# Patient Record
Sex: Female | Born: 1950
Health system: Southern US, Community
[De-identification: ages and names within clinical notes are randomized; demographics above are authoritative.]

## PROBLEM LIST (undated history)

## (undated) DIAGNOSIS — E559 Vitamin D deficiency, unspecified: Secondary | ICD-10-CM

## (undated) DIAGNOSIS — E079 Disorder of thyroid, unspecified: Secondary | ICD-10-CM

## (undated) DIAGNOSIS — B009 Herpesviral infection, unspecified: Secondary | ICD-10-CM

## (undated) DIAGNOSIS — M810 Age-related osteoporosis without current pathological fracture: Secondary | ICD-10-CM

## (undated) DIAGNOSIS — M1990 Unspecified osteoarthritis, unspecified site: Secondary | ICD-10-CM

## (undated) DIAGNOSIS — R899 Unspecified abnormal finding in specimens from other organs, systems and tissues: Secondary | ICD-10-CM

## (undated) DIAGNOSIS — Z8669 Personal history of other diseases of the nervous system and sense organs: Secondary | ICD-10-CM

## (undated) DIAGNOSIS — L900 Lichen sclerosus et atrophicus: Secondary | ICD-10-CM

## (undated) DIAGNOSIS — F988 Other specified behavioral and emotional disorders with onset usually occurring in childhood and adolescence: Secondary | ICD-10-CM

## (undated) DIAGNOSIS — G43909 Migraine, unspecified, not intractable, without status migrainosus: Secondary | ICD-10-CM

## (undated) DIAGNOSIS — F32A Depression, unspecified: Secondary | ICD-10-CM

## (undated) DIAGNOSIS — I729 Aneurysm of unspecified site: Secondary | ICD-10-CM

## (undated) DIAGNOSIS — N879 Dysplasia of cervix uteri, unspecified: Secondary | ICD-10-CM

## (undated) DIAGNOSIS — G4700 Insomnia, unspecified: Secondary | ICD-10-CM

## (undated) DIAGNOSIS — R413 Other amnesia: Secondary | ICD-10-CM

## (undated) DIAGNOSIS — F329 Major depressive disorder, single episode, unspecified: Secondary | ICD-10-CM

## (undated) DIAGNOSIS — S9032XA Contusion of left foot, initial encounter: Secondary | ICD-10-CM

## (undated) HISTORY — DX: Disorder of thyroid, unspecified: E07.9

## (undated) HISTORY — DX: Unspecified osteoarthritis, unspecified site: M19.90

## (undated) HISTORY — PX: OTHER SURGICAL HISTORY: SHX169

## (undated) HISTORY — DX: Migraine, unspecified, not intractable, without status migrainosus: G43.909

## (undated) HISTORY — DX: Age-related osteoporosis without current pathological fracture: M81.0

## (undated) HISTORY — PX: HERNIA REPAIR: SHX51

## (undated) HISTORY — PX: GYNECOLOGIC CRYOSURGERY: SHX857

## (undated) HISTORY — DX: Major depressive disorder, single episode, unspecified: F32.9

## (undated) HISTORY — DX: Herpesviral infection, unspecified: B00.9

## (undated) HISTORY — DX: Other amnesia: R41.3

## (undated) HISTORY — PX: COLPOSCOPY: SHX161

## (undated) HISTORY — DX: Unspecified abnormal finding in specimens from other organs, systems and tissues: R89.9

## (undated) HISTORY — DX: Contusion of left foot, initial encounter: S90.32XA

## (undated) HISTORY — DX: Aneurysm of unspecified site: I72.9

## (undated) HISTORY — DX: Vitamin D deficiency, unspecified: E55.9

## (undated) HISTORY — DX: Lichen sclerosus et atrophicus: L90.0

## (undated) HISTORY — DX: Dysplasia of cervix uteri, unspecified: N87.9

## (undated) HISTORY — DX: Depression, unspecified: F32.A

## (undated) HISTORY — PX: TUBAL LIGATION: SHX77

## (undated) HISTORY — DX: Insomnia, unspecified: G47.00

## (undated) HISTORY — DX: Personal history of other diseases of the nervous system and sense organs: Z86.69

## (undated) HISTORY — DX: Other specified behavioral and emotional disorders with onset usually occurring in childhood and adolescence: F98.8

---

## 1998-10-27 ENCOUNTER — Other Ambulatory Visit: Admission: RE | Admit: 1998-10-27 | Discharge: 1998-10-27 | Payer: Self-pay | Admitting: Obstetrics and Gynecology

## 1999-09-24 ENCOUNTER — Ambulatory Visit (HOSPITAL_COMMUNITY): Admission: RE | Admit: 1999-09-24 | Discharge: 1999-09-24 | Payer: Self-pay | Admitting: Gastroenterology

## 1999-10-08 ENCOUNTER — Other Ambulatory Visit: Admission: RE | Admit: 1999-10-08 | Discharge: 1999-10-08 | Payer: Self-pay | Admitting: Obstetrics and Gynecology

## 1999-10-15 ENCOUNTER — Other Ambulatory Visit: Admission: RE | Admit: 1999-10-15 | Discharge: 1999-10-15 | Payer: Self-pay | Admitting: Obstetrics and Gynecology

## 1999-10-15 ENCOUNTER — Encounter (INDEPENDENT_AMBULATORY_CARE_PROVIDER_SITE_OTHER): Payer: Self-pay

## 2000-10-10 ENCOUNTER — Other Ambulatory Visit: Admission: RE | Admit: 2000-10-10 | Discharge: 2000-10-10 | Payer: Self-pay | Admitting: Obstetrics and Gynecology

## 2001-10-12 ENCOUNTER — Other Ambulatory Visit: Admission: RE | Admit: 2001-10-12 | Discharge: 2001-10-12 | Payer: Self-pay | Admitting: Obstetrics and Gynecology

## 2001-11-28 ENCOUNTER — Ambulatory Visit (HOSPITAL_COMMUNITY): Admission: RE | Admit: 2001-11-28 | Discharge: 2001-11-28 | Payer: Self-pay | Admitting: Gastroenterology

## 2002-04-12 ENCOUNTER — Encounter: Admission: RE | Admit: 2002-04-12 | Discharge: 2002-04-12 | Payer: Self-pay | Admitting: General Surgery

## 2002-04-12 ENCOUNTER — Encounter: Payer: Self-pay | Admitting: General Surgery

## 2002-11-06 ENCOUNTER — Other Ambulatory Visit: Admission: RE | Admit: 2002-11-06 | Discharge: 2002-11-06 | Payer: Self-pay | Admitting: Obstetrics and Gynecology

## 2003-11-27 ENCOUNTER — Other Ambulatory Visit: Admission: RE | Admit: 2003-11-27 | Discharge: 2003-11-27 | Payer: Self-pay | Admitting: Obstetrics and Gynecology

## 2007-04-13 ENCOUNTER — Other Ambulatory Visit: Admission: RE | Admit: 2007-04-13 | Discharge: 2007-04-13 | Payer: Self-pay | Admitting: Obstetrics and Gynecology

## 2007-08-31 ENCOUNTER — Encounter: Admission: RE | Admit: 2007-08-31 | Discharge: 2007-08-31 | Payer: Self-pay | Admitting: Obstetrics and Gynecology

## 2008-01-01 ENCOUNTER — Ambulatory Visit: Payer: Self-pay | Admitting: Obstetrics and Gynecology

## 2008-05-15 ENCOUNTER — Ambulatory Visit: Payer: Self-pay | Admitting: Obstetrics and Gynecology

## 2008-09-04 ENCOUNTER — Ambulatory Visit: Payer: Self-pay | Admitting: Obstetrics and Gynecology

## 2008-09-04 ENCOUNTER — Encounter: Admission: RE | Admit: 2008-09-04 | Discharge: 2008-09-04 | Payer: Self-pay | Admitting: Obstetrics and Gynecology

## 2008-09-04 ENCOUNTER — Encounter: Payer: Self-pay | Admitting: Obstetrics and Gynecology

## 2008-09-04 ENCOUNTER — Other Ambulatory Visit: Admission: RE | Admit: 2008-09-04 | Discharge: 2008-09-04 | Payer: Self-pay | Admitting: Obstetrics and Gynecology

## 2008-12-10 ENCOUNTER — Ambulatory Visit: Payer: Self-pay | Admitting: Obstetrics and Gynecology

## 2009-03-17 ENCOUNTER — Ambulatory Visit: Payer: Self-pay | Admitting: Obstetrics and Gynecology

## 2009-07-28 ENCOUNTER — Ambulatory Visit: Payer: Self-pay | Admitting: Obstetrics and Gynecology

## 2009-07-28 ENCOUNTER — Other Ambulatory Visit: Admission: RE | Admit: 2009-07-28 | Discharge: 2009-07-28 | Payer: Self-pay | Admitting: Obstetrics and Gynecology

## 2009-12-30 ENCOUNTER — Encounter: Admission: RE | Admit: 2009-12-30 | Discharge: 2009-12-30 | Payer: Self-pay | Admitting: Obstetrics and Gynecology

## 2010-02-21 ENCOUNTER — Encounter: Payer: Self-pay | Admitting: Emergency Medicine

## 2010-02-21 ENCOUNTER — Observation Stay (HOSPITAL_COMMUNITY): Admission: EM | Admit: 2010-02-21 | Discharge: 2010-02-22 | Payer: Self-pay

## 2010-06-02 LAB — BASIC METABOLIC PANEL
BUN: 17 mg/dL (ref 6–23)
CO2: 30 mEq/L (ref 19–32)
Calcium: 9 mg/dL (ref 8.4–10.5)
Chloride: 103 mEq/L (ref 96–112)
Creatinine, Ser: 0.73 mg/dL (ref 0.4–1.2)
GFR calc Af Amer: >60 mL/min (ref >60)
GFR calc non Af Amer: >60 mL/min (ref >60)
Glucose, Bld: 87 mg/dL (ref 70–99)
Potassium: 3.7 mEq/L (ref 3.5–5.1)
Sodium: 139 mEq/L (ref 135–145)

## 2010-06-02 LAB — CBC
MCH: 31.8 pg (ref 26.0–34.0)
MCHC: 33.6 g/dL (ref 30.0–36.0)
MCV: 94.7 fL (ref 78.0–100.0)
Platelets: 288 10*3/uL (ref 150–400)
RBC: 4.12 MIL/uL (ref 3.87–5.11)

## 2010-06-23 NOTE — Discharge Summary (Signed)
  Miranda Pierce, Miranda Pierce                 ACCOUNT NO.:  [id number]  MEDICAL RECORD NO.:  [id number]          PATIENT TYPE:  INP  LOCATION:  5153                         FACILITY:  [facility]  PHYSICIAN:  Ollen Gross. Vernell Morgans, M.D. DATE OF BIRTH:  [birth year]  DATE OF ADMISSION:  02/21/2010 DATE OF DISCHARGE:  02/22/2010                              DISCHARGE SUMMARY   DISCHARGE DIAGNOSES: 1. Fall from level ground while roller skating. 2. Closed head injury/concussion.  HISTORY ON ADMISSION:  This is a 60 year old female who was apparently admitted after falling while roller skating.  She fell backwards and hit her head on the floor.  This was a witnessed fall.  She had no loss of consciousness.  She was brought to the emergency room as she appeared somewhat confused and was asking repetitive questions since her fall. Workup at this time including a CT scan of the head was without acute intracranial abnormality.  It looks like the patient was probably originally seen at [facility] and then transferred to [facility] to the Trauma Service for observation, serial neurologic checks, antiemetics, and IV fluids.  HOSPITAL COURSE:  The patient was doing very well the following day. She was ambulatory, tolerating a regular diet, was able to be discharged home.  She is going to follow up in the Trauma Clinic in a couple of weeks. She is to continue all of her other home medications.  DIET:  Regular.     Shawn Rayburn, P.A.   ______________________________ Ollen Gross. Vernell Morgans, M.D.    SR/MEDQ  D:  06/02/2010  T:  06/03/2010  Job:  [id number]  Electronically Signed by Lazaro Arms P.A. on 06/03/2010 04:23:20 PM Electronically Signed by Chevis Pretty III M.D. on 06/15/2010 07:30:48 AM

## 2010-08-07 NOTE — Op Note (Signed)
[place]  Patient:    Miranda Pierce, Miranda Pierce                          MRN: [id number]. Date: 09/24/99 Attending:  Fayrene Fearing L. Randa Evens, M.D. CC:         C. Donovan Kail, M.D.                           Operative Report  PROCEDURE:  Colonoscopy with polypectomy  MEDICATIONS:  Fentanyl 100 mcg, Versed 10 mg IV.  INDICATIONS:  Heme positive stool in a 60 year old woman.  SCOPE:  Pediatric video colonoscope.  DESCRIPTION OF PROCEDURE:  Procedure having been explained to patient, consent obtained.  Patient in left lateral decubitus position.  The Olympus pediatric video colonoscope inserted, advanced under direct visualization.  A large polyp was seen in the rectosigmoid area upon entering.  We advanced rapidly to the cecum. Right lower quadrant transilluminated.  Ileocecal valve seen.  The scope was withdrawn.  The cecum, ascending colon, hepatic flexure, transverse colon, splenic flexure, descending and sigmoid colon were seen.  Twelve cm from the anal verge, a 1.75 cm polyp on a snare was seen.  This was an ulcerated, very angry looking polyp.  It was removed with the snare and recovered.  There was no bleeding at the polypectomy site.  The scope was withdrawn.  The patient tolerated the procedure well.  ASSESSMENT:  Rectosigmoid polyp removed with snare.  PLAN:  Will check pathology and due to the size of the polyp, will recommend a 2-year repeat colonoscopy. DD:  09/24/99 TD:  09/24/99 Job: [id number]

## 2010-08-07 NOTE — Op Note (Signed)
   NAME:  Miranda Pierce, Miranda Pierce                           ACCOUNT NO.:  [id number]   MEDICAL RECORD NO.:  [id number]                   PATIENT TYPE:  AMB   LOCATION:  ENDO                                 FACILITY:  [facility]   PHYSICIAN:  James L. Malon Kindle., M.D.          DATE OF BIRTH:  1950/06/07   DATE OF PROCEDURE:  11/28/2001  DATE OF DISCHARGE:                                 OPERATIVE REPORT   PROCEDURE:  Colonoscopy.   MEDICATIONS:  Fentanyl 75 mcg, Versed 6 mg IV.   SCOPE:  Olympus pediatric video colonoscope.   INDICATIONS:  Patient with a previous 2 cm rectal polyp removed.  This is  done as a two-year follow-up.   DESCRIPTION OF PROCEDURE:  The procedure had been explained to the patient  and consent obtained.  With the patient in the left lateral decubitus  position, the Olympus pediatric video colonoscope inserted and advanced  under direct visualization.  Prep excellent, and we were able to reach the  cecum without difficulty, with the ileocecal valve and appendiceal orifice  seen.  The scope withdrawn, and the cecum, ascending colon, hepatic flexure,  transverse colon, splenic flexure, descending, and sigmoid colon were seen  well upon removal.  No polyps or other lesions seen.  The rectum was  carefully examined and was free of polyps.  The scope was withdrawn.  The  patient tolerated the procedure well, was maintained on low-flow oxygen and  pulse oximetry throughout the procedure.   ASSESSMENT:  No evidence of further colon polyps.   PLAN:  Will recommend repeating in three years.                                               James L. Malon Kindle., M.D.    Miranda Pierce  D:  11/28/2001  T:  11/28/2001  Job:  [id number]   cc:   C. Miguel Aschoff, M.D.  [address]  [place]  [place] [address]  Fax: [phone]

## 2010-08-19 ENCOUNTER — Other Ambulatory Visit (HOSPITAL_COMMUNITY)
Admission: RE | Admit: 2010-08-19 | Discharge: 2010-08-19 | Disposition: A | Discharge status: Home / Self Care | Payer: BC Managed Care – PPO | Source: Ambulatory Visit | Attending: Obstetrics and Gynecology | Admitting: Obstetrics and Gynecology

## 2010-08-19 ENCOUNTER — Encounter (INDEPENDENT_AMBULATORY_CARE_PROVIDER_SITE_OTHER): Payer: BC Managed Care – PPO | Admitting: Obstetrics and Gynecology

## 2010-08-19 ENCOUNTER — Other Ambulatory Visit: Payer: Self-pay | Admitting: Obstetrics and Gynecology

## 2010-08-19 DIAGNOSIS — Z01419 Encounter for gynecological examination (general) (routine) without abnormal findings: Secondary | ICD-10-CM

## 2010-08-19 DIAGNOSIS — Z13220 Encounter for screening for lipoid disorders: Secondary | ICD-10-CM

## 2010-08-19 DIAGNOSIS — Z124 Encounter for screening for malignant neoplasm of cervix: Secondary | ICD-10-CM | POA: Insufficient documentation

## 2010-08-19 DIAGNOSIS — E079 Disorder of thyroid, unspecified: Secondary | ICD-10-CM

## 2010-08-19 DIAGNOSIS — M899 Disorder of bone, unspecified: Secondary | ICD-10-CM

## 2010-08-19 DIAGNOSIS — R82998 Other abnormal findings in urine: Secondary | ICD-10-CM

## 2010-09-09 ENCOUNTER — Encounter (INDEPENDENT_AMBULATORY_CARE_PROVIDER_SITE_OTHER): Payer: BC Managed Care – PPO

## 2010-09-09 DIAGNOSIS — M899 Disorder of bone, unspecified: Secondary | ICD-10-CM

## 2010-09-09 DIAGNOSIS — M949 Disorder of cartilage, unspecified: Secondary | ICD-10-CM

## 2010-12-14 ENCOUNTER — Other Ambulatory Visit: Payer: Self-pay | Admitting: Obstetrics and Gynecology

## 2010-12-14 DIAGNOSIS — Z1231 Encounter for screening mammogram for malignant neoplasm of breast: Secondary | ICD-10-CM

## 2010-12-31 ENCOUNTER — Other Ambulatory Visit: Payer: Self-pay | Admitting: Obstetrics and Gynecology

## 2011-01-05 ENCOUNTER — Ambulatory Visit
Admission: RE | Admit: 2011-01-05 | Discharge: 2011-01-05 | Disposition: A | Discharge status: Home / Self Care | Payer: BC Managed Care – PPO | Source: Ambulatory Visit | Attending: Obstetrics and Gynecology | Admitting: Obstetrics and Gynecology

## 2011-01-05 DIAGNOSIS — Z1231 Encounter for screening mammogram for malignant neoplasm of breast: Secondary | ICD-10-CM

## 2011-06-09 ENCOUNTER — Ambulatory Visit (INDEPENDENT_AMBULATORY_CARE_PROVIDER_SITE_OTHER): Payer: BC Managed Care – PPO | Admitting: Licensed Clinical Social Worker

## 2011-06-09 DIAGNOSIS — F4323 Adjustment disorder with mixed anxiety and depressed mood: Secondary | ICD-10-CM

## 2011-06-16 ENCOUNTER — Ambulatory Visit (INDEPENDENT_AMBULATORY_CARE_PROVIDER_SITE_OTHER): Payer: BC Managed Care – PPO | Admitting: Licensed Clinical Social Worker

## 2011-06-16 DIAGNOSIS — F331 Major depressive disorder, recurrent, moderate: Secondary | ICD-10-CM

## 2011-06-21 ENCOUNTER — Ambulatory Visit: Payer: BC Managed Care – PPO | Admitting: Licensed Clinical Social Worker

## 2011-06-28 ENCOUNTER — Encounter: Payer: Self-pay | Admitting: Gynecology

## 2011-06-28 DIAGNOSIS — M8580 Other specified disorders of bone density and structure, unspecified site: Secondary | ICD-10-CM | POA: Insufficient documentation

## 2011-06-28 DIAGNOSIS — G43909 Migraine, unspecified, not intractable, without status migrainosus: Secondary | ICD-10-CM | POA: Insufficient documentation

## 2011-06-28 DIAGNOSIS — L900 Lichen sclerosus et atrophicus: Secondary | ICD-10-CM | POA: Insufficient documentation

## 2011-06-28 DIAGNOSIS — M1990 Unspecified osteoarthritis, unspecified site: Secondary | ICD-10-CM | POA: Insufficient documentation

## 2011-06-28 DIAGNOSIS — F988 Other specified behavioral and emotional disorders with onset usually occurring in childhood and adolescence: Secondary | ICD-10-CM | POA: Insufficient documentation

## 2011-06-29 ENCOUNTER — Ambulatory Visit (INDEPENDENT_AMBULATORY_CARE_PROVIDER_SITE_OTHER): Payer: BC Managed Care – PPO | Admitting: Obstetrics and Gynecology

## 2011-06-29 DIAGNOSIS — N760 Acute vaginitis: Secondary | ICD-10-CM

## 2011-06-29 DIAGNOSIS — N898 Other specified noninflammatory disorders of vagina: Secondary | ICD-10-CM

## 2011-06-29 DIAGNOSIS — B373 Candidiasis of vulva and vagina: Secondary | ICD-10-CM

## 2011-06-29 DIAGNOSIS — B3731 Acute candidiasis of vulva and vagina: Secondary | ICD-10-CM

## 2011-06-29 DIAGNOSIS — R102 Pelvic and perineal pain: Secondary | ICD-10-CM

## 2011-06-29 DIAGNOSIS — B9689 Other specified bacterial agents as the cause of diseases classified elsewhere: Secondary | ICD-10-CM

## 2011-06-29 DIAGNOSIS — L293 Anogenital pruritus, unspecified: Secondary | ICD-10-CM

## 2011-06-29 DIAGNOSIS — N949 Unspecified condition associated with female genital organs and menstrual cycle: Secondary | ICD-10-CM

## 2011-06-29 DIAGNOSIS — A499 Bacterial infection, unspecified: Secondary | ICD-10-CM

## 2011-06-29 LAB — WET PREP FOR TRICH, YEAST, CLUE: Yeast Wet Prep HPF POC: NONE SEEN

## 2011-06-29 MED ORDER — TERCONAZOLE 0.8 % VA CREA: 1.0000 | TOPICAL_CREAM | Freq: Every day | VAGINAL | Status: AC

## 2011-06-29 MED ORDER — METRONIDAZOLE 0.75 % VA GEL: VAGINAL | Status: DC

## 2011-06-29 NOTE — Patient Instructions (Signed)
Initially just get MetroGel filled.

## 2011-06-29 NOTE — Progress Notes (Signed)
Patient came to see me today because of a vaginal discharge with an odor and vulvar itching. The itching was worse but she used Monistat-3 and is better but not gone. She is also having some lower abdominal discomfort that feels like menstrual cramps although she has no bleeding. This has been going on for several months. There is no pattern to it.  Exam: Kennon Portela present. Abdomen soft without guarding rebound or masses. Pelvic exam: External: 3+vulvitis. BUS within normal limits. Vaginal exam: Within normal limits. Cervix: Clean. Uterus: Within normal limits. Adnexa: Within normal limits. Rectovaginal exam: Within normal limits.  Wet prep positive for WBCs and bacteria only.  Assessment: #1. Bacterial vaginosis #2. Probable persistent yeast vaginitis #3. Lower pelvic pain  Plan: MetroGel vaginal cream 1 applicator full at bedtime in the vagina for 5 days. If she still has vaginal symptoms after this terconazole 3 cream for 3 days. If pain persists after both call and schedule pelvic ultrasound.

## 2011-07-09 ENCOUNTER — Ambulatory Visit (INDEPENDENT_AMBULATORY_CARE_PROVIDER_SITE_OTHER): Payer: BC Managed Care – PPO | Admitting: Licensed Clinical Social Worker

## 2011-07-09 DIAGNOSIS — F331 Major depressive disorder, recurrent, moderate: Secondary | ICD-10-CM

## 2011-07-14 ENCOUNTER — Ambulatory Visit (INDEPENDENT_AMBULATORY_CARE_PROVIDER_SITE_OTHER): Payer: BC Managed Care – PPO | Admitting: Licensed Clinical Social Worker

## 2011-07-14 DIAGNOSIS — F331 Major depressive disorder, recurrent, moderate: Secondary | ICD-10-CM

## 2011-07-21 ENCOUNTER — Ambulatory Visit: Payer: BC Managed Care – PPO | Admitting: Licensed Clinical Social Worker

## 2011-08-24 ENCOUNTER — Encounter: Payer: Self-pay | Admitting: Obstetrics and Gynecology

## 2011-08-24 ENCOUNTER — Ambulatory Visit (INDEPENDENT_AMBULATORY_CARE_PROVIDER_SITE_OTHER): Payer: BC Managed Care – PPO | Admitting: Obstetrics and Gynecology

## 2011-08-24 VITALS — BP 120/78 | Ht 62.0 in | Wt 163.0 lb

## 2011-08-24 DIAGNOSIS — E039 Hypothyroidism, unspecified: Secondary | ICD-10-CM

## 2011-08-24 DIAGNOSIS — N898 Other specified noninflammatory disorders of vagina: Secondary | ICD-10-CM

## 2011-08-24 DIAGNOSIS — L293 Anogenital pruritus, unspecified: Secondary | ICD-10-CM

## 2011-08-24 DIAGNOSIS — N879 Dysplasia of cervix uteri, unspecified: Secondary | ICD-10-CM | POA: Insufficient documentation

## 2011-08-24 DIAGNOSIS — Z01419 Encounter for gynecological examination (general) (routine) without abnormal findings: Secondary | ICD-10-CM

## 2011-08-24 LAB — CBC WITH DIFFERENTIAL/PLATELET
Basophils Absolute: 0 10*3/uL (ref 0.0–0.1)
Basophils Relative: 0 % (ref 0–1)
Eosinophils Relative: 2 % (ref 0–5)
HCT: 41.5 % (ref 36.0–46.0)
MCHC: 34 g/dL (ref 30.0–36.0)
MCV: 93.7 fL (ref 78.0–100.0)
Monocytes Absolute: 0.4 10*3/uL (ref 0.1–1.0)
Platelets: 321 10*3/uL (ref 150–400)
RDW: 13 % (ref 11.5–15.5)

## 2011-08-24 LAB — WET PREP FOR TRICH, YEAST, CLUE
Clue Cells Wet Prep HPF POC: NONE SEEN
Trich, Wet Prep: NONE SEEN
Yeast Wet Prep HPF POC: NONE SEEN

## 2011-08-24 LAB — TSH: TSH: 1.713 u[IU]/mL (ref 0.350–4.500)

## 2011-08-24 LAB — LIPID PANEL: LDL Cholesterol: 108 mg/dL — ABNORMAL HIGH (ref 0–99)

## 2011-08-24 MED ORDER — CONJ ESTROG-MEDROXYPROGEST ACE 0.45-1.5 MG PO TABS: 1.0000 | ORAL_TABLET | Freq: Every day | ORAL | Status: DC

## 2011-08-24 MED ORDER — AZITHROMYCIN 250 MG PO TABS: ORAL_TABLET | ORAL | Status: DC

## 2011-08-24 NOTE — Progress Notes (Signed)
Patient came to see me today for her annual GYN exam. She remains on hormone replacement therapy due to significant symptoms that recur when she tries to stop it. We treated her for vaginitis several months ago with MetroGel. We have also given her a prescription for terconazole 3 cream which she did not fill but she now is having vaginal itching and discharge. She continues to get occasional itching from her left labia at the site where she had biopsy-proven lichen sclerosus. The skin on the outside is very sensitive and will easily tear with intercourse. She is currently not using the Temovate ointment we have given her. She is having no vaginal bleeding. She is up-to-date on mammograms. She is having no pelvic pain. She is having vaginal dryness in spite of using HRT. Her bone density last year showed low bone mass without an elevated FRAX risk. She is taking calcium and vitamin D. She has not had a fracture. The patient is having sinusitis with increasing drainage, hoarseness and discomfort. She wanted me to check her TSH for the physician that is treating her hypothyroidism. The patient was treated greater than 20 years ago for CIN with cryosurgery and has had normal Paps since then.  Physical examination: kim Julian Reil present. HEENT within normal limits. Neck: Thyroid not large. No masses. Supraclavicular nodes: not enlarged. Breasts: Examined in both sitting and lying  position. No skin changes and no masses. Abdomen: Soft no guarding rebound or masses or hernia. Pelvic: External:area of stable lichen sclerosis on left labia biopsy proven. BUS: Within normal limits. Vaginal:within normal limits. Poor estrogen effect. No evidence of cystocele rectocele or enterocele. Obvious yeast infection. Cervix: clean. Uterus: Normal size and shape. Adnexa: No masses. Rectovaginal exam: Confirmatory and negative.  Assessment: #1. Menopausal symptoms #2. Yeast infection #3. Atrophic vaginitis #4. Osteopenia #5.  Sinusitis #6. Lichen sclerosis  Plan: We had her usual discussion regarding the higher risk of breast cancer with long-term HRT. Patient feels benefits outweigh the risks. We discussed estrogen patch to reduce the risk of DVT. Patient declined. We will continue her Prempro. We added  Vagifem 10 MCG's 1 BIW for vaginal dryness. She was given a Z-Pak for sinusitis. She will continue yearly mammograms. She will have a bone density next year. Extremities: Within normal limits. She will fill her terconazole 3 cream. She will call when necessary for Temovate ointment.

## 2011-08-25 ENCOUNTER — Other Ambulatory Visit: Payer: Self-pay | Admitting: *Deleted

## 2011-08-25 DIAGNOSIS — R17 Unspecified jaundice: Secondary | ICD-10-CM

## 2011-08-25 LAB — URINALYSIS W MICROSCOPIC + REFLEX CULTURE
Bacteria, UA: NONE SEEN
Casts: NONE SEEN
Crystals: NONE SEEN
Glucose, UA: NEGATIVE mg/dL
Ketones, ur: NEGATIVE mg/dL
Leukocytes, UA: NEGATIVE
Nitrite: NEGATIVE
Specific Gravity, Urine: 1.015 (ref 1.005–1.030)
pH: 5.5 (ref 5.0–8.0)

## 2011-08-27 ENCOUNTER — Telehealth: Payer: Self-pay | Admitting: *Deleted

## 2011-08-27 MED ORDER — TERCONAZOLE 0.8 % VA CREA: 1.0000 | TOPICAL_CREAM | Freq: Every day | VAGINAL | Status: AC

## 2011-08-27 NOTE — Telephone Encounter (Signed)
Pt saw had annual on 08/24/11 and thought she has rx at home for yeast that never got filled, but pt lost Rx, terconazole 3 day will be sent to pharmacy per G note.

## 2011-09-03 ENCOUNTER — Other Ambulatory Visit: Payer: BC Managed Care – PPO

## 2011-09-03 ENCOUNTER — Telehealth: Payer: Self-pay | Admitting: *Deleted

## 2011-09-03 DIAGNOSIS — R17 Unspecified jaundice: Secondary | ICD-10-CM

## 2011-09-03 LAB — COMPREHENSIVE METABOLIC PANEL
AST: 16 U/L (ref 0–37)
Alkaline Phosphatase: 63 U/L (ref 39–117)
BUN: 21 mg/dL (ref 6–23)
Calcium: 9.3 mg/dL (ref 8.4–10.5)
Creat: 0.76 mg/dL (ref 0.50–1.10)
Total Bilirubin: 0.5 mg/dL (ref 0.3–1.2)

## 2011-09-03 NOTE — Telephone Encounter (Signed)
Pt has repeat CMP lab draw and asked if fasting was needed, pt informed okay to eat food.

## 2011-10-11 ENCOUNTER — Other Ambulatory Visit: Payer: Self-pay | Admitting: Obstetrics and Gynecology

## 2011-10-15 ENCOUNTER — Telehealth: Payer: Self-pay | Admitting: *Deleted

## 2011-10-15 MED ORDER — ESTRADIOL 10 MCG VA TABS: 10.0000 ug | ORAL_TABLET | VAGINAL | Status: DC

## 2011-10-15 NOTE — Telephone Encounter (Signed)
Pt called requesting rx for vagifem 10 mcg twice weekly. Rx sent pt given samples in office visit 08/24/11. Pt informed

## 2011-12-14 ENCOUNTER — Other Ambulatory Visit: Payer: Self-pay | Admitting: Obstetrics and Gynecology

## 2011-12-14 DIAGNOSIS — Z1231 Encounter for screening mammogram for malignant neoplasm of breast: Secondary | ICD-10-CM

## 2012-01-06 ENCOUNTER — Ambulatory Visit: Payer: BC Managed Care – PPO

## 2012-03-21 ENCOUNTER — Other Ambulatory Visit: Payer: Self-pay | Admitting: Gynecology

## 2012-03-21 DIAGNOSIS — Z1231 Encounter for screening mammogram for malignant neoplasm of breast: Secondary | ICD-10-CM

## 2012-04-18 ENCOUNTER — Ambulatory Visit: Payer: BC Managed Care – PPO

## 2012-05-16 ENCOUNTER — Ambulatory Visit
Admission: RE | Admit: 2012-05-16 | Discharge: 2012-05-16 | Disposition: A | Discharge status: Home / Self Care | Payer: BC Managed Care – PPO | Source: Ambulatory Visit | Attending: Gynecology | Admitting: Gynecology

## 2012-05-16 DIAGNOSIS — Z1231 Encounter for screening mammogram for malignant neoplasm of breast: Secondary | ICD-10-CM

## 2012-05-18 ENCOUNTER — Ambulatory Visit (INDEPENDENT_AMBULATORY_CARE_PROVIDER_SITE_OTHER): Payer: BC Managed Care – PPO | Admitting: Women's Health

## 2012-05-18 ENCOUNTER — Encounter: Payer: Self-pay | Admitting: Women's Health

## 2012-05-18 ENCOUNTER — Other Ambulatory Visit: Payer: Self-pay | Admitting: Women's Health

## 2012-05-18 DIAGNOSIS — N949 Unspecified condition associated with female genital organs and menstrual cycle: Secondary | ICD-10-CM

## 2012-05-18 DIAGNOSIS — N898 Other specified noninflammatory disorders of vagina: Secondary | ICD-10-CM

## 2012-05-18 DIAGNOSIS — R350 Frequency of micturition: Secondary | ICD-10-CM

## 2012-05-18 LAB — URINALYSIS W MICROSCOPIC + REFLEX CULTURE
Crystals: NONE SEEN
Ketones, ur: 15 mg/dL — AB
Nitrite: NEGATIVE
Specific Gravity, Urine: 1.01 (ref 1.005–1.030)
Urobilinogen, UA: 0.2 mg/dL (ref 0.0–1.0)

## 2012-05-18 LAB — WET PREP FOR TRICH, YEAST, CLUE
Clue Cells Wet Prep HPF POC: NONE SEEN
WBC, Wet Prep HPF POC: NONE SEEN

## 2012-05-18 MED ORDER — CLOBETASOL PROP EMOLLIENT BASE 0.05 % EX CREA: TOPICAL_CREAM | CUTANEOUS | Status: DC

## 2012-05-18 NOTE — Progress Notes (Signed)
Patient ID: Leonard Schwartz, female   DOB: 10/20/1950, 62 y.o.   MRN: [id number] Presents with the complaint of increased urinary frequency, denies pain burning or urgency. Denies abdominal pain or fever.reports vaginal itching mostly external. Sexually active with one partner, unfaithful. History of lichen sclerosis.  Exam: External genitalia, patches of white/lichen sclerosis on perineum that extended labia majora,mostly left side speculum exam scant discharge minimal erythema. Wet prep negative. Bimanual no CMT or adnexal fullness or tenderness. GC/Chlamydia culture taken and is pending. UA: Few bacteria,  Lichen sclerosis  Plan: Temovate twice daily to areas of irritation, instructed to call if no relief. Condoms encouraged. Urine culture pending. Declines HIV, hepatitis or RPR today will have assessed at annual exam. Continue counseling.

## 2012-05-19 LAB — GC/CHLAMYDIA PROBE AMP
CT Probe RNA: NEGATIVE
GC Probe RNA: NEGATIVE

## 2012-05-20 LAB — URINE CULTURE: Colony Count: NO GROWTH

## 2012-08-31 ENCOUNTER — Encounter: Payer: Self-pay | Admitting: Women's Health

## 2012-09-28 ENCOUNTER — Encounter: Payer: Self-pay | Admitting: Women's Health

## 2012-09-28 ENCOUNTER — Other Ambulatory Visit (HOSPITAL_COMMUNITY)
Admission: RE | Admit: 2012-09-28 | Discharge: 2012-09-28 | Disposition: A | Discharge status: Home / Self Care | Payer: BC Managed Care – PPO | Source: Ambulatory Visit | Attending: Gynecology | Admitting: Gynecology

## 2012-09-28 ENCOUNTER — Telehealth: Payer: Self-pay | Admitting: *Deleted

## 2012-09-28 ENCOUNTER — Ambulatory Visit (INDEPENDENT_AMBULATORY_CARE_PROVIDER_SITE_OTHER): Payer: BC Managed Care – PPO | Admitting: Women's Health

## 2012-09-28 VITALS — BP 130/88 | Ht 61.5 in | Wt 135.0 lb

## 2012-09-28 DIAGNOSIS — Z01419 Encounter for gynecological examination (general) (routine) without abnormal findings: Secondary | ICD-10-CM

## 2012-09-28 DIAGNOSIS — N63 Unspecified lump in unspecified breast: Secondary | ICD-10-CM

## 2012-09-28 DIAGNOSIS — N898 Other specified noninflammatory disorders of vagina: Secondary | ICD-10-CM

## 2012-09-28 DIAGNOSIS — N949 Unspecified condition associated with female genital organs and menstrual cycle: Secondary | ICD-10-CM

## 2012-09-28 DIAGNOSIS — Z780 Asymptomatic menopausal state: Secondary | ICD-10-CM

## 2012-09-28 MED ORDER — CLOBETASOL PROP EMOLLIENT BASE 0.05 % EX CREA: TOPICAL_CREAM | CUTANEOUS | Status: DC

## 2012-09-28 NOTE — Telephone Encounter (Signed)
Message copied by Aura Camps on Thu Sep 28, 2012  3:05 PM ------      Message from: [place], [place] J      Created: Thu Sep 28, 2012  2:49 PM       Small BB size mobile new nodule in lt lower breast quad. Normal mammogram 05/2012 at [facility], needs diagnostic.   Anytime ok July 31th             [name] ------

## 2012-09-28 NOTE — Patient Instructions (Addendum)
Vit d 2000 dailyHealth Recommendations for Postmenopausal Women Respected and ongoing research has looked at the most common causes of death, disability, and poor quality of life in postmenopausal women. The causes include heart disease, diseases of blood vessels, diabetes, depression, cancer, and bone loss (osteoporosis). Many things can be done to help lower the chances of developing these and other common problems: CARDIOVASCULAR DISEASE Heart Disease: A heart attack is a medical emergency. Know the signs and symptoms of a heart attack. Below are things women can do to reduce their risk for heart disease.   Do not smoke. If you smoke, quit.  Aim for a healthy weight. Being overweight causes many preventable deaths. Eat a healthy and balanced diet and drink an adequate amount of liquids.  Get moving. Make a commitment to be more physically active. Aim for 30 minutes of activity on most, if not all days of the week.  Eat for heart health. Choose a diet that is low in saturated fat and cholesterol and eliminate trans fat. Include whole grains, vegetables, and fruits. Read and understand the labels on food containers before buying.  Know your numbers. Ask your caregiver to check your blood pressure, cholesterol (total, HDL, LDL, triglycerides) and blood glucose. Work with your caregiver on improving your entire clinical picture.  High blood pressure. Limit or stop your table salt intake (try salt substitute and food seasonings). Avoid salty foods and drinks. Read labels on food containers before buying. Eating well and exercising can help control high blood pressure. STROKE  Stroke is a medical emergency. Stroke may be the result of a blood clot in a blood vessel in the brain or by a brain hemorrhage (bleeding). Know the signs and symptoms of a stroke. To lower the risk of developing a stroke:  Avoid fatty foods.  Quit smoking.  Control your diabetes, blood pressure, and irregular heart  rate. THROMBOPHLEBITIS (BLOOD CLOT) OF THE LEG  Becoming overweight and leading a stationary lifestyle may also contribute to developing blood clots. Controlling your diet and exercising will help lower the risk of developing blood clots. CANCER SCREENING  Breast Cancer: Take steps to reduce your risk of breast cancer.  You should practice "breast self-awareness." This means understanding the normal appearance and feel of your breasts and should include breast self-examination. Any changes detected, no matter how small, should be reported to your caregiver.  After age 40, you should have a clinical breast exam (CBE) every year.  Starting at age 40, you should consider having a mammogram (breast X-ray) every year.  If you have a family history of breast cancer, talk to your caregiver about genetic screening.  If you are at high risk for breast cancer, talk to your caregiver about having an MRI and a mammogram every year.  Intestinal or Stomach Cancer: Tests to consider are a rectal exam, fecal occult blood, sigmoidoscopy, and colonoscopy. Women who are high risk may need to be screened at an earlier age and more often.  Cervical Cancer:  Beginning at age 30, you should have a Pap test every 3 years as long as the past 3 Pap tests have been normal.  If you have had past treatment for cervical cancer or a condition that could lead to cancer, you need Pap tests and screening for cancer for at least 20 years after your treatment.  If you had a hysterectomy for a problem that was not cancer or a condition that could lead to cancer, then you no longer need   Pap tests.  If you are between ages 65 and 70, and you have had normal Pap tests going back 10 years, you no longer need Pap tests.  If Pap tests have been discontinued, risk factors (such as a new sexual partner) need to be reassessed to determine if screening should be resumed.  Some medical problems can increase the chance of getting  cervical cancer. In these cases, your caregiver may recommend more frequent screening and Pap tests.  Uterine Cancer: If you have vaginal bleeding after reaching menopause, you should notify your caregiver.  Ovarian cancer: Other than yearly pelvic exams, there are no reliable tests available to screen for ovarian cancer at this time except for yearly pelvic exams.  Lung Cancer: Yearly chest X-rays can detect lung cancer and should be done on high risk women, such as cigarette smokers and women with chronic lung disease (emphysema).  Skin Cancer: A complete body skin exam should be done at your yearly examination. Avoid overexposure to the sun and ultraviolet light lamps. Use a strong sun block cream when in the sun. All of these things are important in lowering the risk of skin cancer. MENOPAUSE Menopause Symptoms: Hormone therapy products are effective for treating symptoms associated with menopause:  Moderate to severe hot flashes.  Night sweats.  Mood swings.  Headaches.  Tiredness.  Loss of sex drive.  Insomnia.  Other symptoms. Hormone replacement carries certain risks, especially in older women. Women who use or are thinking about using estrogen or estrogen with progestin treatments should discuss that with their caregiver. Your caregiver will help you understand the benefits and risks. The ideal dose of hormone replacement therapy is not known. The Food and Drug Administration (FDA) has concluded that hormone therapy should be used only at the lowest doses and for the shortest amount of time to reach treatment goals.  OSTEOPOROSIS Protecting Against Bone Loss and Preventing Fracture: If you use hormone therapy for prevention of bone loss (osteoporosis), the risks for bone loss must outweigh the risk of the therapy. Ask your caregiver about other medications known to be safe and effective for preventing bone loss and fractures. To guard against bone loss or fractures, the  following is recommended:  If you are less than age 50, take 1000 mg of calcium and at least 600 mg of Vitamin D per day.  If you are greater than age 50 but less than age 70, take 1200 mg of calcium and at least 600 mg of Vitamin D per day.  If you are greater than age 70, take 1200 mg of calcium and at least 800 mg of Vitamin D per day. Smoking and excessive alcohol intake increases the risk of osteoporosis. Eat foods rich in calcium and vitamin D and do weight bearing exercises several times a week as your caregiver suggests. DIABETES Diabetes Melitus: If you have Type I or Type 2 diabetes, you should keep your blood sugar under control with diet, exercise and recommended medication. Avoid too many sweets, starchy and fatty foods. Being overweight can make control more difficult. COGNITION AND MEMORY Cognition and Memory: Menopausal hormone therapy is not recommended for the prevention of cognitive disorders such as Alzheimer's disease or memory loss.  DEPRESSION  Depression may occur at any age, but is common in elderly women. The reasons may be because of physical, medical, social (loneliness), or financial problems and needs. If you are experiencing depression because of medical problems and control of symptoms, talk to your caregiver about this.   Physical activity and exercise may help with mood and sleep. Community and volunteer involvement may help your sense of value and worth. If you have depression and you feel that the problem is getting worse or becoming severe, talk to your caregiver about treatment options that are best for you. ACCIDENTS  Accidents are common and can be serious in the elderly woman. Prepare your house to prevent accidents. Eliminate throw rugs, place hand bars in the bath, shower and toilet areas. Avoid wearing high heeled shoes or walking on wet, snowy, and icy areas. Limit or stop driving if you have vision or hearing problems, or you feel you are unsteady with you  movements and reflexes. HEPATITIS C Hepatitis C is a type of viral infection affecting the liver. It is spread mainly through contact with blood from an infected person. It can be treated, but if left untreated, it can lead to severe liver damage over years. Many people who are infected do not know that the virus is in their blood. If you are a "baby-boomer", it is recommended that you have one screening test for Hepatitis C. IMMUNIZATIONS  Several immunizations are important to consider having during your senior years, including:   Tetanus, diptheria, and pertussis booster shot.  Influenza every year before the flu season begins.  Pneumonia vaccine.  Shingles vaccine.  Others as indicated based on your specific needs. Talk to your caregiver about these. Document Released: 04/30/2005 Document Revised: 02/23/2012 Document Reviewed: 12/25/2007 [employer] Patient Information 2014 [employer].  

## 2012-09-28 NOTE — Telephone Encounter (Signed)
Order placed, they will contact pt to schedule. 

## 2012-09-28 NOTE — Progress Notes (Signed)
Miranda Pierce October 15, 1950 [id number]    History:    The patient presents for annual exam.  Postmenopausal on no HRT with no bleeding. History of benign colon polyp, negative colonoscopy 2010. CIN-1 greater than 20 years ago with normal Paps after. Normal mammograms, mammogram 05/2012 3D tomography. Has had zostavac . DEXA 2012 osteopenia T score  -2 right femoral neck, FRAX 9.8%/1.3% .Long-term problems with anxiety and depression seeing a counselor for therapy and meds. Lichen sclerosis. Hypothyroid primary care.  Past medical history, past surgical history, family history and social history were all reviewed and documented in the EPIC chart. Retired, living with her boyfriend near [place], would like to move back to [place] to be closer to grandchildren.   ROS:  A  ROS was performed and pertinent positives and negatives are included in the history.  Exam:  Filed Vitals:   09/29[birth year] 1400  BP: 130/88    General appearance:  Normal Head/Neck:  Normal, without cervical or supraclavicular adenopathy. Thyroid:  Symmetrical, normal in size, without palpable masses or nodularity. Respiratory  Effort:  Normal  Auscultation:  Clear without wheezing or rhonchi Cardiovascular  Auscultation:  Regular rate, without rubs, murmurs or gallops  Edema/varicosities:  Not grossly evident Abdominal  Soft,nontender, without masses, guarding or rebound.  Liver/spleen:  No organomegaly noted  Hernia:  None appreciated  Skin  Inspection:  Grossly normal  Palpation:  Grossly normal Neurologic/psychiatric  Orientation:  Normal with appropriate conversation.  Mood/affect:  Normal  Genitourinary    Breasts: Examined lying and sitting.     Right: Without masses, retractions, discharge or axillary adenopathy.     Left: BB size mobile nontender nodule below areola  External genitalia:  Labia minora bilateral areas of lichen sclerosis  BUS/Urethra/Skene's glands:  Normal  Bladder:  Normal  Vagina:   Normal  Cervix:  Normal  Uterus:   normal in size, shape and contour.  Midline and mobile  Adnexa/parametria:     Rt: Without masses or tenderness.   Lt: Without masses or tenderness.  Anus and perineum: Normal  Digital rectal exam: Normal sphincter tone without palpated masses or tenderness  Assessment/Plan:  62 y.o. D WF G4 P4 for annual exam with complaint of questionable nodule in left breast  Postmenopausal no HRT/no bleeding Osteopenia Lichen sclerosis Hypothyroid-primary care Depression-counselor - meds.  Plan: Diagnostic mammogram left breast BB size mobile nodule. Very concerned, has a friend with breast cancer. Temovate twice daily small amount to irritated areas of labia. Instructed to call if no relief of symptoms. Continue SBE's, annual mammogram, calcium rich diet, vitamin D 2000 daily, home safety and fall prevention importance of regular exercise discussed. Repeat DEXA. Pap,  normal 2012, new screening guidelines reviewed. Blood pressure 130/88 will check away from office if it continues elevated will followup with primary care. Has lost 35 pounds in the past year with diet and exercise, history of eating disorder, reviewed importance of stability of weight.    Harrington Challenger [facility], 5:16 PM 09/28/2012

## 2012-09-29 ENCOUNTER — Encounter: Payer: Self-pay | Admitting: Obstetrics and Gynecology

## 2012-09-29 ENCOUNTER — Other Ambulatory Visit: Payer: Self-pay | Admitting: Women's Health

## 2012-09-29 DIAGNOSIS — N63 Unspecified lump in unspecified breast: Secondary | ICD-10-CM

## 2012-10-04 NOTE — Telephone Encounter (Signed)
appt 10/19/12@ 10:20 am

## 2012-10-19 ENCOUNTER — Other Ambulatory Visit: Payer: BC Managed Care – PPO

## 2012-10-24 ENCOUNTER — Ambulatory Visit
Admission: RE | Admit: 2012-10-24 | Discharge: 2012-10-24 | Disposition: A | Discharge status: Home / Self Care | Payer: Self-pay | Source: Ambulatory Visit | Attending: Women's Health | Admitting: Women's Health

## 2012-10-24 ENCOUNTER — Ambulatory Visit
Admission: RE | Admit: 2012-10-24 | Discharge: 2012-10-24 | Disposition: A | Discharge status: Home / Self Care | Payer: No Typology Code available for payment source | Source: Ambulatory Visit | Attending: Women's Health | Admitting: Women's Health

## 2012-10-24 DIAGNOSIS — N63 Unspecified lump in unspecified breast: Secondary | ICD-10-CM

## 2013-01-25 ENCOUNTER — Other Ambulatory Visit: Payer: Self-pay

## 2013-10-09 ENCOUNTER — Encounter: Payer: BC Managed Care – PPO | Admitting: Women's Health

## 2013-10-12 ENCOUNTER — Encounter: Payer: Self-pay | Admitting: Women's Health

## 2013-10-12 ENCOUNTER — Ambulatory Visit (INDEPENDENT_AMBULATORY_CARE_PROVIDER_SITE_OTHER): Payer: BC Managed Care – PPO | Admitting: Women's Health

## 2013-10-12 VITALS — BP 117/76 | Ht 63.0 in | Wt 141.8 lb

## 2013-10-12 DIAGNOSIS — Z01419 Encounter for gynecological examination (general) (routine) without abnormal findings: Secondary | ICD-10-CM

## 2013-10-12 NOTE — Patient Instructions (Signed)
Health Recommendations for Postmenopausal Women Respected and ongoing research has looked at the most common causes of death, disability, and poor quality of life in postmenopausal women. The causes include heart disease, diseases of blood vessels, diabetes, depression, cancer, and bone loss (osteoporosis). Many things can be done to help lower the chances of developing these and other common problems. CARDIOVASCULAR DISEASE Heart Disease: A heart attack is a medical emergency. Know the signs and symptoms of a heart attack. Below are things women can do to reduce their risk for heart disease.   Do not smoke. If you smoke, quit.  Aim for a healthy weight. Being overweight causes many preventable deaths. Eat a healthy and balanced diet and drink an adequate amount of liquids.  Get moving. Make a commitment to be more physically active. Aim for 30 minutes of activity on most, if not all days of the week.  Eat for heart health. Choose a diet that is low in saturated fat and cholesterol and eliminate trans fat. Include whole grains, vegetables, and fruits. Read and understand the labels on food containers before buying.  Know your numbers. Ask your caregiver to check your blood pressure, cholesterol (total, HDL, LDL, triglycerides) and blood glucose. Work with your caregiver on improving your entire clinical picture.  High blood pressure. Limit or stop your table salt intake (try salt substitute and food seasonings). Avoid salty foods and drinks. Read labels on food containers before buying. Eating well and exercising can help control high blood pressure. STROKE  Stroke is a medical emergency. Stroke may be the result of a blood clot in a blood vessel in the brain or by a brain hemorrhage (bleeding). Know the signs and symptoms of a stroke. To lower the risk of developing a stroke:  Avoid fatty foods.  Quit smoking.  Control your diabetes, blood pressure, and irregular heart rate. THROMBOPHLEBITIS  (BLOOD CLOT) OF THE LEG  Becoming overweight and leading a stationary lifestyle may also contribute to developing blood clots. Controlling your diet and exercising will help lower the risk of developing blood clots. CANCER SCREENING  Breast Cancer: Take steps to reduce your risk of breast cancer.  You should practice "breast self-awareness." This means understanding the normal appearance and feel of your breasts and should include breast self-examination. Any changes detected, no matter how small, should be reported to your caregiver.  After age 40, you should have a clinical breast exam (CBE) every year.  Starting at age 40, you should consider having a mammogram (breast X-ray) every year.  If you have a family history of breast cancer, talk to your caregiver about genetic screening.  If you are at high risk for breast cancer, talk to your caregiver about having an MRI and a mammogram every year.  Intestinal or Stomach Cancer: Tests to consider are a rectal exam, fecal occult blood, sigmoidoscopy, and colonoscopy. Women who are high risk may need to be screened at an earlier age and more often.  Cervical Cancer:  Beginning at age 30, you should have a Pap test every 3 years as long as the past 3 Pap tests have been normal.  If you have had past treatment for cervical cancer or a condition that could lead to cancer, you need Pap tests and screening for cancer for at least 20 years after your treatment.  If you had a hysterectomy for a problem that was not cancer or a condition that could lead to cancer, then you no longer need Pap tests.    If you are between ages 65 and 70, and you have had normal Pap tests going back 10 years, you no longer need Pap tests.  If Pap tests have been discontinued, risk factors (such as a new sexual partner) need to be reassessed to determine if screening should be resumed.  Some medical problems can increase the chance of getting cervical cancer. In these  cases, your caregiver may recommend more frequent screening and Pap tests.  Uterine Cancer: If you have vaginal bleeding after reaching menopause, you should notify your caregiver.  Ovarian Cancer: Other than yearly pelvic exams, there are no reliable tests available to screen for ovarian cancer at this time except for yearly pelvic exams.  Lung Cancer: Yearly chest X-rays can detect lung cancer and should be done on high risk women, such as cigarette smokers and women with chronic lung disease (emphysema).  Skin Cancer: A complete body skin exam should be done at your yearly examination. Avoid overexposure to the sun and ultraviolet light lamps. Use a strong sun block cream when in the sun. All of these things are important for lowering the risk of skin cancer. MENOPAUSE Menopause Symptoms: Hormone therapy products are effective for treating symptoms associated with menopause:  Moderate to severe hot flashes.  Night sweats.  Mood swings.  Headaches.  Tiredness.  Loss of sex drive.  Insomnia.  Other symptoms. Hormone replacement carries certain risks, especially in older women. Women who use or are thinking about using estrogen or estrogen with progestin treatments should discuss that with their caregiver. Your caregiver will help you understand the benefits and risks. The ideal dose of hormone replacement therapy is not known. The Food and Drug Administration (FDA) has concluded that hormone therapy should be used only at the lowest doses and for the shortest amount of time to reach treatment goals.  OSTEOPOROSIS Protecting Against Bone Loss and Preventing Fracture If you use hormone therapy for prevention of bone loss (osteoporosis), the risks for bone loss must outweigh the risk of the therapy. Ask your caregiver about other medications known to be safe and effective for preventing bone loss and fractures. To guard against bone loss or fractures, the following is recommended:  If  you are younger than age 50, take 1000 mg of calcium and at least 600 mg of Vitamin D per day.  If you are older than age 50 but younger than age 70, take 1200 mg of calcium and at least 600 mg of Vitamin D per day.  If you are older than age 70, take 1200 mg of calcium and at least 800 mg of Vitamin D per day. Smoking and excessive alcohol intake increases the risk of osteoporosis. Eat foods rich in calcium and vitamin D and do weight bearing exercises several times a week as your caregiver suggests. DIABETES Diabetes Mellitus: If you have type I or type 2 diabetes, you should keep your blood sugar under control with diet, exercise, and recommended medication. Avoid starchy and fatty foods, and too many sweets. Being overweight can make diabetes control more difficult. COGNITION AND MEMORY Cognition and Memory: Menopausal hormone therapy is not recommended for the prevention of cognitive disorders such as Alzheimer's disease or memory loss.  DEPRESSION  Depression may occur at any age, but it is common in elderly women. This may be because of physical, medical, social (loneliness), or financial problems and needs. If you are experiencing depression because of medical problems and control of symptoms, talk to your caregiver about this. Physical   activity and exercise may help with mood and sleep. Community and volunteer involvement may improve your sense of value and worth. If you have depression and you feel that the problem is getting worse or becoming severe, talk to your caregiver about which treatment options are best for you. ACCIDENTS  Accidents are common and can be serious in elderly woman. Prepare your house to prevent accidents. Eliminate throw rugs, place hand bars in bath, shower, and toilet areas. Avoid wearing high heeled shoes or walking on wet, snowy, and icy areas. Limit or stop driving if you have vision or hearing problems, or if you feel you are unsteady with your movements and  reflexes. HEPATITIS C Hepatitis C is a type of viral infection affecting the liver. It is spread mainly through contact with blood from an infected person. It can be treated, but if left untreated, it can lead to severe liver damage over the years. Many people who are infected do not know that the virus is in their blood. If you are a "baby-boomer", it is recommended that you have one screening test for Hepatitis C. IMMUNIZATIONS  Several immunizations are important to consider having during your senior years, including:   Tetanus, diphtheria, and pertussis booster shot.  Influenza every year before the flu season begins.  Pneumonia vaccine.  Shingles vaccine.  Others, as indicated based on your specific needs. Talk to your caregiver about these. Document Released: 04/30/2005 Document Revised: 07/23/2013 Document Reviewed: 12/25/2007 Exit[employer] Patient Information 2015 [employer]. This information is not intended to replace advice given to you by your health care provider. Make sure you discuss any questions you have with your health care provider.  

## 2013-10-12 NOTE — Progress Notes (Signed)
Miranda SchwartzKaren L Pierce 08/11/1950 [id number]    History:    Presents for annual exam.  Postmenopausal on no HRT/no bleeding. Normal mammogram history. CIN-1 greater than 20 years ago with normal Paps after. Has had zostavac. 2012 T score -2  right femoral neck FRAX 9.8%/1.3%. Negative colonoscopy 2010. Lichen sclerosis with minimal symptoms. Depression psychiatrist manages medications. Having urinary frequency without pain or burning.   Past medical history, past surgical history, family history and social history were all reviewed and documented in the EPIC chart. Living in [place] with boyfriend, would like to move back to [place] to be near grandchildren. Has 4 grown children, son having marital issues, daughter fibromyalgia.   ROS:  A  12 point ROS was performed and pertinent positives and negatives are included.  Exam:  Filed Vitals:   10/12/13 0817  BP: 117/76    General appearance:  Normal Thyroid:  Symmetrical, normal in size, without palpable masses or nodularity. Respiratory  Auscultation:  Clear without wheezing or rhonchi Cardiovascular  Auscultation:  Regular rate, without rubs, murmurs or gallops  Edema/varicosities:  Not grossly evident Abdominal  Soft,nontender, without masses, guarding or rebound.  Liver/spleen:  No organomegaly noted  Hernia:  None appreciated  Skin  Inspection:  Grossly normal   Breasts: Examined lying and sitting.     Right: Without masses, retractions, discharge or axillary adenopathy.     Left: Without masses, retractions, discharge or axillary adenopathy. Gentitourinary   Inguinal/mons:  Normal without inguinal adenopathy  External genitalia:  Lichen sclerosis small amount bilateral labia minora and perineum extending to rectal  BUS/Urethra/Skene's glands:  Normal  Vagina:  Normal, no discharge or odor noted  Cervix:  Normal  Uterus:   normal in size, shape and contour.  Midline and mobile  Adnexa/parametria:     Rt: Without masses or  tenderness.   Lt: Without masses or tenderness.  Anus and perineum: Normal  Digital rectal exam: Normal sphincter tone without palpated masses or tenderness  Assessment/Plan:  63 y.o. DWF G4P4 for annual exam.     Asymptomatic lichen sclerosis Osteopenia Depression-psychiatrist manages Postmenopausal/no bleeding/no HRT Hypothyroid primary care manages labs and meds.  Plan: Repeat DEXA, will schedule. Home safety, fall prevention importance of regular weight bearing exercise discussed. Vitamin D 2000 daily encouraged. Instructed to have a vitamin D level drawn when she has her labs drawn at primary care. Continue active lifestyle, regular exercise, SBE's, annual mammogram, 3-D tomography reviewed and encouraged history of dense breasts. Pap normal 2014, new screening guidelines reviewed. UA   Note: This dictation was prepared with Dragon/digital dictation.  Any transcriptional errors that result are unintentional. Harrington ChallengerYOUNG,Cherise Fedder J Four State Surgery CenterWHNP, 9:07 AM 10/12/2013

## 2013-10-13 LAB — URINALYSIS W MICROSCOPIC + REFLEX CULTURE
Bacteria, UA: NONE SEEN
Glucose, UA: NEGATIVE mg/dL
Hgb urine dipstick: NEGATIVE
Ketones, ur: NEGATIVE mg/dL
LEUKOCYTES UA: NEGATIVE
NITRITE: NEGATIVE
Protein, ur: NEGATIVE mg/dL
SPECIFIC GRAVITY, URINE: 1.03 (ref 1.005–1.030)
SQUAMOUS EPITHELIAL / LPF: NONE SEEN
UROBILINOGEN UA: 0.2 mg/dL (ref 0.0–1.0)
pH: 6 (ref 5.0–8.0)

## 2013-10-17 ENCOUNTER — Telehealth: Payer: Self-pay | Admitting: *Deleted

## 2013-10-17 NOTE — Telephone Encounter (Signed)
Pt called requesting u/a fron OV 10/12/13. Left on voicemail u/a negative for infection.

## 2013-10-18 ENCOUNTER — Other Ambulatory Visit: Payer: Self-pay | Admitting: Gynecology

## 2013-10-18 DIAGNOSIS — Z780 Asymptomatic menopausal state: Secondary | ICD-10-CM

## 2013-10-20 DIAGNOSIS — M810 Age-related osteoporosis without current pathological fracture: Secondary | ICD-10-CM

## 2013-10-20 HISTORY — DX: Age-related osteoporosis without current pathological fracture: M81.0

## 2013-11-08 ENCOUNTER — Ambulatory Visit: Payer: BC Managed Care – PPO

## 2013-11-08 ENCOUNTER — Encounter: Payer: Self-pay | Admitting: Gynecology

## 2013-11-08 ENCOUNTER — Telehealth: Payer: Self-pay | Admitting: Gynecology

## 2013-11-08 ENCOUNTER — Other Ambulatory Visit: Payer: Self-pay | Admitting: Gynecology

## 2013-11-08 DIAGNOSIS — M8580 Other specified disorders of bone density and structure, unspecified site: Secondary | ICD-10-CM

## 2013-11-08 DIAGNOSIS — Z780 Asymptomatic menopausal state: Secondary | ICD-10-CM

## 2013-11-08 DIAGNOSIS — M810 Age-related osteoporosis without current pathological fracture: Secondary | ICD-10-CM

## 2013-11-08 NOTE — Telephone Encounter (Signed)
PT INFORMED WITH THE BELOW NOTE. 

## 2013-11-08 NOTE — Telephone Encounter (Signed)
Tell patient that her bone density shows osteoporosis.  Recommend office visit to discuss treatment options. 

## 2013-11-12 ENCOUNTER — Other Ambulatory Visit: Payer: Self-pay | Admitting: Gynecology

## 2013-11-12 DIAGNOSIS — M810 Age-related osteoporosis without current pathological fracture: Secondary | ICD-10-CM

## 2013-12-03 ENCOUNTER — Other Ambulatory Visit: Payer: Self-pay

## 2013-12-03 DIAGNOSIS — Z1231 Encounter for screening mammogram for malignant neoplasm of breast: Secondary | ICD-10-CM

## 2013-12-11 ENCOUNTER — Encounter: Payer: Self-pay | Admitting: Gynecology

## 2013-12-11 ENCOUNTER — Ambulatory Visit (INDEPENDENT_AMBULATORY_CARE_PROVIDER_SITE_OTHER): Payer: BC Managed Care – PPO | Admitting: Gynecology

## 2013-12-11 DIAGNOSIS — M810 Age-related osteoporosis without current pathological fracture: Secondary | ICD-10-CM

## 2013-12-11 DIAGNOSIS — Z23 Encounter for immunization: Secondary | ICD-10-CM

## 2013-12-11 LAB — COMPREHENSIVE METABOLIC PANEL
ALT: 14 U/L (ref 0–35)
AST: 16 U/L (ref 0–37)
Albumin: 4.4 g/dL (ref 3.5–5.2)
Alkaline Phosphatase: 65 U/L (ref 39–117)
BUN: 18 mg/dL (ref 6–23)
CALCIUM: 9.6 mg/dL (ref 8.4–10.5)
CHLORIDE: 105 meq/L (ref 96–112)
CO2: 28 meq/L (ref 19–32)
CREATININE: 0.77 mg/dL (ref 0.50–1.10)
Glucose, Bld: 80 mg/dL (ref 70–99)
Potassium: 5 mEq/L (ref 3.5–5.3)
Sodium: 139 mEq/L (ref 135–145)
Total Bilirubin: 0.5 mg/dL (ref 0.2–1.2)
Total Protein: 6.6 g/dL (ref 6.0–8.3)

## 2013-12-11 LAB — TSH: TSH: 4.003 u[IU]/mL (ref 0.350–4.500)

## 2013-12-11 MED ORDER — ALENDRONATE SODIUM 70 MG PO TABS: 70.0000 mg | ORAL_TABLET | ORAL | Status: DC

## 2013-12-11 NOTE — Progress Notes (Signed)
Miranda Pierce 11-16-1950 [id number]        63 y.o.  [id number] presents to discuss her most recent DEXA that shows T score -2.5. Significant loss of all measured sites.  Past medical history,surgical history, problem list, medications, allergies, family history and social history were all reviewed and documented in the EPIC chart.  Directed ROS with pertinent positives and negatives documented in the history of present illness/assessment and plan.   Assessment/Plan:  63 y.o. [id number] with osteoporosis. I reviewed in detail the diagnosis and increased fracture risk. I also reviewed her statistically significant loss of density at all measured sites. I am going to check for secondary causes to include TSH, PTH, vitamin D, comprehensive metabolic panel and 24-hour urine calcium excretion. I reviewed treatment options with her in detail. I discussed alendronate, how to take the medication, the side effects and risks. GERD, osteonecrosis of the jaw, atypical fractures particularly with prolonged use reviewed. After a lengthy discussion and explanation of her report we both agree on starting alendronate 70 mg weekly. We will plan on a five-year course with follow up DEXA in 2 years and then a drug-free holiday if she has a favorable response.     Dara Lords MD, 10:28 AM 12/11/2013

## 2013-12-11 NOTE — Addendum Note (Signed)
Addended by: Dayna Barker on: 12/11/2013 10:39 AM   Modules accepted: Orders

## 2013-12-11 NOTE — Patient Instructions (Signed)
Start on the alendronate (Fosamax) as we discussed. Call me if you have any issues.  Alendronate weekly tablets What is this medicine? ALENDRONATE (a LEN droe nate) slows calcium loss from bones. It helps to make healthy bone and to slow bone loss in people with osteoporosis. It may be used to treat Paget's disease. This medicine may be used for other purposes; ask your health care provider or pharmacist if you have questions. COMMON BRAND NAME(S): Fosamax What should I tell my health care provider before I take this medicine? They need to know if you have any of these conditions: -esophagus, stomach, or intestine problems, like acid-reflux or GERD -dental disease -kidney disease -low blood calcium -low vitamin D -problems swallowing -problems sitting or standing for 30 minutes -an unusual or allergic reaction to alendronate, other medicines, foods, dyes, or preservatives -pregnant or trying to get pregnant -breast-feeding How should I use this medicine? You must take this medicine exactly as directed or you will lower the amount of medicine you absorb into your body or you may cause yourself harm. Take your dose by mouth first thing in the morning, after you are up for the day. Do not eat or drink anything before you take this medicine. Swallow your medicine with a full glass (6 to 8 fluid ounces) of plain water. Do not take this tablet with any other drink. Do not chew or crush the tablet. After taking this medicine, do not eat breakfast, drink, or take any medicines or vitamins for at least 30 minutes. Stand or sit up for at least 30 minutes after you take this medicine; do not lie down. Take this medicine on the same day every week. Do not take your medicine more often than directed. Talk to your pediatrician regarding the use of this medicine in children. Special care may be needed. Overdosage: If you think you have taken too much of this medicine contact a poison control center or  emergency room at once. NOTE: This medicine is only for you. Do not share this medicine with others. What if I miss a dose? If you miss a dose, take the dose on the morning after you remember. Then take your next dose on your regular day of the week. Never take 2 tablets on the same day. Do not take double or extra doses. What may interact with this medicine? -aluminum hydroxide -antacids -aspirin -calcium supplements -drugs for inflammation like ibuprofen, naproxen, and others -iron supplements -magnesium supplements -vitamins with minerals This list may not describe all possible interactions. Give your health care provider a list of all the medicines, herbs, non-prescription drugs, or dietary supplements you use. Also tell them if you smoke, drink alcohol, or use illegal drugs. Some items may interact with your medicine. What should I watch for while using this medicine? Visit your doctor or health care professional for regular checks ups. It may be some time before you see benefit from this medicine. Do not stop taking your medication except on your doctor's advice. Your doctor or health care professional may order blood tests and other tests to see how you are doing. You should make sure you get enough calcium and vitamin D while you are taking this medicine, unless your doctor tells you not to. Discuss the foods you eat and the vitamins you take with your health care professional. Some people who take this medicine have severe bone, joint, and/or muscle pain. This medicine may also increase your risk for a broken thigh bone. Tell  your doctor right away if you have pain in your upper leg or groin. Tell your doctor if you have any pain that does not go away or that gets worse. This medicine can make you more sensitive to the sun. If you get a rash while taking this medicine, sunlight may cause the rash to get worse. Keep out of the sun. If you cannot avoid being in the sun, wear protective  clothing and use sunscreen. Do not use sun lamps or tanning beds/booths. What side effects may I notice from receiving this medicine? Side effects that you should report to your doctor or health care professional as soon as possible: -allergic reactions like skin rash, itching or hives, swelling of the face, lips, or tongue -black or tarry stools -bone, muscle or joint pain -changes in vision -chest pain -heartburn or stomach pain -jaw pain, especially after dental work -pain or trouble when swallowing -redness, blistering, peeling or loosening of the skin, including inside the mouth Side effects that usually do not require medical attention (report to your doctor or health care professional if they continue or are bothersome): -changes in taste -diarrhea or constipation -eye pain or itching -headache -nausea or vomiting -stomach gas or fullness This list may not describe all possible side effects. Call your doctor for medical advice about side effects. You may report side effects to FDA at 1-800-FDA-1088. Where should I keep my medicine? Keep out of the reach of children. Store at room temperature of 15 and 30 degrees C (59 and 86 degrees F). Throw away any unused medicine after the expiration date. NOTE: This sheet is a summary. It may not cover all possible information. If you have questions about this medicine, talk to your doctor, pharmacist, or health care provider.  2015, [employer]. (2011-01-21 09:02:42)

## 2013-12-12 ENCOUNTER — Ambulatory Visit
Admission: RE | Admit: 2013-12-12 | Discharge: 2013-12-12 | Disposition: A | Discharge status: Home / Self Care | Payer: BC Managed Care – PPO | Source: Ambulatory Visit

## 2013-12-12 DIAGNOSIS — Z1231 Encounter for screening mammogram for malignant neoplasm of breast: Secondary | ICD-10-CM

## 2013-12-12 LAB — URINALYSIS W MICROSCOPIC + REFLEX CULTURE
Bacteria, UA: NONE SEEN
Casts: NONE SEEN
Glucose, UA: NEGATIVE mg/dL
HGB URINE DIPSTICK: NEGATIVE
Ketones, ur: NEGATIVE mg/dL
LEUKOCYTES UA: NEGATIVE
Nitrite: NEGATIVE
PROTEIN: NEGATIVE mg/dL
Specific Gravity, Urine: 1.026 (ref 1.005–1.030)
UROBILINOGEN UA: 0.2 mg/dL (ref 0.0–1.0)
pH: 5.5 (ref 5.0–8.0)

## 2013-12-12 LAB — VITAMIN D 25 HYDROXY (VIT D DEFICIENCY, FRACTURES): Vit D, 25-Hydroxy: 62 ng/mL (ref 30–89)

## 2013-12-12 LAB — PARATHYROID HORMONE, INTACT (NO CA): PTH: 21 pg/mL (ref 14–64)

## 2013-12-16 ENCOUNTER — Encounter: Payer: Self-pay | Admitting: Women's Health

## 2013-12-26 ENCOUNTER — Telehealth: Payer: Self-pay | Admitting: *Deleted

## 2013-12-26 NOTE — Telephone Encounter (Signed)
Pt called with concerns about her recent urine result on 12/11/13. Urine has Bilirubin and calcium oxalate crystal which has been noted before, asked if this could be possible issues with kidney stones and liver problems.

## 2013-12-26 NOTE — Telephone Encounter (Signed)
Telephone call reviewed labs and answered questions.

## 2014-01-21 ENCOUNTER — Encounter: Payer: Self-pay | Admitting: Gynecology

## 2014-05-02 ENCOUNTER — Encounter: Payer: Self-pay | Admitting: Women's Health

## 2014-09-16 ENCOUNTER — Other Ambulatory Visit: Payer: Self-pay

## 2014-11-14 ENCOUNTER — Encounter: Payer: Self-pay | Admitting: Women's Health

## 2014-11-14 ENCOUNTER — Other Ambulatory Visit (HOSPITAL_COMMUNITY)
Admission: RE | Admit: 2014-11-14 | Discharge: 2014-11-14 | Disposition: A | Discharge status: Home / Self Care | Payer: Medicare Other | Source: Ambulatory Visit | Attending: Gynecology | Admitting: Gynecology

## 2014-11-14 ENCOUNTER — Encounter: Payer: BC Managed Care – PPO | Admitting: Women's Health

## 2014-11-14 ENCOUNTER — Ambulatory Visit (INDEPENDENT_AMBULATORY_CARE_PROVIDER_SITE_OTHER): Payer: Medicare Other | Admitting: Women's Health

## 2014-11-14 VITALS — BP 120/84 | Ht 63.0 in | Wt 156.0 lb

## 2014-11-14 DIAGNOSIS — Z124 Encounter for screening for malignant neoplasm of cervix: Secondary | ICD-10-CM | POA: Insufficient documentation

## 2014-11-14 DIAGNOSIS — Z01419 Encounter for gynecological examination (general) (routine) without abnormal findings: Secondary | ICD-10-CM | POA: Diagnosis not present

## 2014-11-14 DIAGNOSIS — M810 Age-related osteoporosis without current pathological fracture: Secondary | ICD-10-CM | POA: Diagnosis not present

## 2014-11-14 MED ORDER — ALENDRONATE SODIUM 70 MG PO TABS: 70.0000 mg | ORAL_TABLET | ORAL | Status: DC

## 2014-11-14 NOTE — Addendum Note (Signed)
Addended by: Kem Parkinson on: 11/14/2014 11:08 AM   Modules accepted: Orders

## 2014-11-14 NOTE — Patient Instructions (Signed)
Overactive Bladder The bladder has two functions that are totally opposite of the other. One is to relax and stretch out so it can store urine (fills like a balloon), and the other is to contract and squeeze down so that it can empty the urine that it has stored. Proper functioning of the bladder is a complex mixing of these two functions. The filling and emptying of the bladder can be influenced by:  The bladder.  The spinal cord.  The brain.  The nerves going to the bladder.  Other organs that are closely related to the bladder such as prostate in males and the vagina in females. As your bladder fills with urine, nerve signals are sent from the bladder to the brain to tell you that you may need to urinate. Normal urination requires that the bladder squeeze down with sufficient strength to empty the bladder, but this also requires that the bladder squeeze down sufficiently long to finish the job. In addition the sphincter muscles, which normally keep you from leaking urine, must also relax so that the urine can pass. Coordination between the bladder muscle squeezing down and the sphincter muscles relaxing is required to make everything happen normally. With an overactive bladder sometimes the muscles of the bladder contract unexpectedly and involuntarily and this causes an urgent need to urinate. The normal response is to try to hold urine in by contracting the sphincter muscles. Sometimes the bladder contracts so strongly that the sphincter muscles cannot stop the urine from passing out and incontinence occurs. This kind of incontinence is called urge incontinence. Having an overactive bladder can be embarrassing and awkward. It can keep you from living life the way you want to. Many people think it is just something you have to put up with as you grow older or have certain health conditions. In fact, there are treatments that can help make your life easier and more pleasant. CAUSES  Many things  can cause an overactive bladder. Possibilities include:  Urinary tract infection or infection of nearby tissues such as the prostate.  Prostate enlargement.  In women, multiple pregnancies or surgery on the uterus or urethra.  Bladder stones, inflammation, or tumors.  Caffeine.  Alcohol.  Medications. For example, diuretics (drugs that help the body get rid of extra fluid) increase urine production. Some other medicines must be taken with lots of fluids.  Muscle or nerve weakness. This might be the result of a spinal cord injury, a stroke, multiple sclerosis, or Parkinson disease.  Diabetes can cause a high urine volume which fills the bladder so quickly that the normal urge to urinate is triggered very strongly. SYMPTOMS   Loss of bladder control. You feel the need to urinate and cannot make your body wait.  Sudden, strong urges to urinate.  Urinating 8 or more times a day.  Waking up to urinate two or more times a night. DIAGNOSIS  To decide if you have overactive bladder, your health care provider will probably:  Ask about symptoms you have noticed.  Ask about your overall health. This will include questions about any medications you are taking.  Do a physical examination. This will help determine if there are obvious blockages or other problems.  Order some tests. These might include:  A blood test to check for diabetes or other health issues that could be contributing to the problem.  Urine testing. This could measure the flow of urine and the pressure on the bladder.  A test of your neurological   system (the brain, spinal cord, and nerves). This is the system that senses the need to urinate. Some of these tests are called flow tests, bladder pressure tests, and electrical measurements of the sphincter muscle.  A bladder test to check whether it is emptying completely when you urinate.  Cystoscopy. This test uses a thin tube with a tiny camera on it. It offers a  look inside your urethra and bladder to see if there are problems.  Imaging tests. You might be given a contrast dye and then asked to urinate. X-rays are taken to see how your bladder is working. TREATMENT  An overactive bladder can be treated in many ways. The treatment will depend on the cause. Whether you have a mild or severe case also makes a difference. Often, treatment can be given in your health care provider's office or clinic. Be sure to discuss the different options with your caregiver. They include:  Behavioral treatments. These do not involve medication or surgery:  Bladder training. For this, you would follow a schedule to urinate at regular intervals. This helps you learn to control the urge to urinate. At first, you might be asked to wait a few minutes after feeling the urge. In time, you should be able to schedule bathroom visits an hour or more apart.  Kegel exercises. These exercises strengthen the pelvic floor muscles, which support the bladder. Toning these muscles can help control urination even if the bladder muscles are overactive. A specialist will teach you how to do these exercises correctly. They will require daily practice.  Weight loss. If you are obese or overweight, losing weight might stop your bladder from being overactive. Talk to your health care provider about how many pounds you should lose. Also ask if there is a specific program or method that would work best for you.  Diet change. This might be suggested if constipation is making your overactive bladder worse. Your health care provider or a nutritionist can explain ways to change what you eat to ease constipation. Other people might need to take in less caffeine or alcohol. Sometimes drinking fewer fluids is needed, too.  Protection. This is not an actual treatment. But, you could wear special pads to take care of any leakage while you wait for other treatments to take effect. This will help you avoid  embarrassment.  Physical treatments.  Electrical stimulation. Electrodes will send gentle pulses to the nerves or muscles that help control the bladder. The goal is to strengthen them. Sometimes this is done with the electrodes outside the body. Or, they might be placed inside the body (implanted). This treatment can take several months to have an effect.  Medications. These are usually used along with other treatments. Several medicines are available. Some are injected into the muscles involved in urination. Others come in pill form. Medications sometimes prescribed include:  Anticholinergics. These drugs block the signals that the nerves deliver to the bladder. This keeps it from releasing urine at the wrong time. Researchers think the drugs might help in other ways, too.  Imipramine. This is an antidepressant. But, it relaxes bladder muscles.  Botox. This is still experimental. Some people believe that injecting it into the bladder muscles will relax them so they work more normally. It has also been injected into the sphincter muscle when the sphincter muscle does not open properly. This is a temporary fix, however. Also, it might make matters worse, especially in older people.  Surgery.  A device might be implanted   to help manage your nerves. It works on the nerves that signal when you need to urinate.  Surgery is sometimes needed with electrical stimulation. If the electrodes are implanted, this is done through surgery.  Sometimes repairs need to be made through surgery. For example, the size of the bladder can be changed. This is usually done in severe cases only. HOME CARE INSTRUCTIONS   Take any medications your health care provider prescribed or suggested. Follow the directions carefully.  Practice any lifestyle changes that are recommended. These might include:  Drinking less fluid or drinking at different times of the day. If you need to urinate often during the night, for  example, you may need to stop drinking fluids early in the evening.  Cutting down on caffeine or alcohol. They can both make an overactive bladder worse. Caffeine is found in coffee, tea, and sodas.  Doing Kegel exercises to strengthen muscles.  Losing weight, if that is recommended.  Eating a healthy and balanced diet. This will help you avoid constipation.  Keep a journal or a log. You might be asked to record how much you drink and when, and also when you feel the need to urinate.  Learn how to care for implants or other devices, such as pessaries. SEEK MEDICAL CARE IF:   Your overactive bladder gets worse.  You feel increased pain or irritation when you urinate.  You notice blood in your urine.  You have questions about any medications or devices that your health care provider recommended.  You notice blood, pus, or swelling at the site of any test or treatment procedure.  You have an oral temperature above 102F (38.9C). SEEK IMMEDIATE MEDICAL CARE IF:  You have an oral temperature above 102F (38.9C), not controlled by medicine. Document Released: 01/02/2009 Document Revised: 07/23/2013 Document Reviewed: 01/02/2009 Exit[employer] Patient Information 2015 [employer]. This information is not intended to replace advice given to you by your health care provider. Make sure you discuss any questions you have with your health care provider. Health Recommendations for Postmenopausal Women Respected and ongoing research has looked at the most common causes of death, disability, and poor quality of life in postmenopausal women. The causes include heart disease, diseases of blood vessels, diabetes, depression, cancer, and bone loss (osteoporosis). Many things can be done to help lower the chances of developing these and other common problems. CARDIOVASCULAR DISEASE Heart Disease: A heart attack is a medical emergency. Know the signs and symptoms of a heart attack. Below are things women  can do to reduce their risk for heart disease.   Do not smoke. If you smoke, quit.  Aim for a healthy weight. Being overweight causes many preventable deaths. Eat a healthy and balanced diet and drink an adequate amount of liquids.  Get moving. Make a commitment to be more physically active. Aim for 30 minutes of activity on most, if not all days of the week.  Eat for heart health. Choose a diet that is low in saturated fat and cholesterol and eliminate trans fat. Include whole grains, vegetables, and fruits. Read and understand the labels on food containers before buying.  Know your numbers. Ask your caregiver to check your blood pressure, cholesterol (total, HDL, LDL, triglycerides) and blood glucose. Work with your caregiver on improving your entire clinical picture.  High blood pressure. Limit or stop your table salt intake (try salt substitute and food seasonings). Avoid salty foods and drinks. Read labels on food containers before buying. Eating well and   exercising can help control high blood pressure. STROKE  Stroke is a medical emergency. Stroke may be the result of a blood clot in a blood vessel in the brain or by a brain hemorrhage (bleeding). Know the signs and symptoms of a stroke. To lower the risk of developing a stroke:  Avoid fatty foods.  Quit smoking.  Control your diabetes, blood pressure, and irregular heart rate. THROMBOPHLEBITIS (BLOOD CLOT) OF THE LEG  Becoming overweight and leading a stationary lifestyle may also contribute to developing blood clots. Controlling your diet and exercising will help lower the risk of developing blood clots. CANCER SCREENING  Breast Cancer: Take steps to reduce your risk of breast cancer.  You should practice "breast self-awareness." This means understanding the normal appearance and feel of your breasts and should include breast self-examination. Any changes detected, no matter how small, should be reported to your caregiver.  After  age 40, you should have a clinical breast exam (CBE) every year.  Starting at age 40, you should consider having a mammogram (breast X-ray) every year.  If you have a family history of breast cancer, talk to your caregiver about genetic screening.  If you are at high risk for breast cancer, talk to your caregiver about having an MRI and a mammogram every year.  Intestinal or Stomach Cancer: Tests to consider are a rectal exam, fecal occult blood, sigmoidoscopy, and colonoscopy. Women who are high risk may need to be screened at an earlier age and more often.  Cervical Cancer:  Beginning at age 30, you should have a Pap test every 3 years as long as the past 3 Pap tests have been normal.  If you have had past treatment for cervical cancer or a condition that could lead to cancer, you need Pap tests and screening for cancer for at least 20 years after your treatment.  If you had a hysterectomy for a problem that was not cancer or a condition that could lead to cancer, then you no longer need Pap tests.  If you are between ages 65 and 70, and you have had normal Pap tests going back 10 years, you no longer need Pap tests.  If Pap tests have been discontinued, risk factors (such as a new sexual partner) need to be reassessed to determine if screening should be resumed.  Some medical problems can increase the chance of getting cervical cancer. In these cases, your caregiver may recommend more frequent screening and Pap tests.  Uterine Cancer: If you have vaginal bleeding after reaching menopause, you should notify your caregiver.  Ovarian Cancer: Other than yearly pelvic exams, there are no reliable tests available to screen for ovarian cancer at this time except for yearly pelvic exams.  Lung Cancer: Yearly chest X-rays can detect lung cancer and should be done on high risk women, such as cigarette smokers and women with chronic lung disease (emphysema).  Skin Cancer: A complete body skin  exam should be done at your yearly examination. Avoid overexposure to the sun and ultraviolet light lamps. Use a strong sun block cream when in the sun. All of these things are important for lowering the risk of skin cancer. MENOPAUSE Menopause Symptoms: Hormone therapy products are effective for treating symptoms associated with menopause:  Moderate to severe hot flashes.  Night sweats.  Mood swings.  Headaches.  Tiredness.  Loss of sex drive.  Insomnia.  Other symptoms. Hormone replacement carries certain risks, especially in older women. Women who use or are thinking   about using estrogen or estrogen with progestin treatments should discuss that with their caregiver. Your caregiver will help you understand the benefits and risks. The ideal dose of hormone replacement therapy is not known. The Food and Drug Administration (FDA) has concluded that hormone therapy should be used only at the lowest doses and for the shortest amount of time to reach treatment goals.  OSTEOPOROSIS Protecting Against Bone Loss and Preventing Fracture If you use hormone therapy for prevention of bone loss (osteoporosis), the risks for bone loss must outweigh the risk of the therapy. Ask your caregiver about other medications known to be safe and effective for preventing bone loss and fractures. To guard against bone loss or fractures, the following is recommended:  If you are younger than age 50, take 1000 mg of calcium and at least 600 mg of Vitamin D per day.  If you are older than age 50 but younger than age 70, take 1200 mg of calcium and at least 600 mg of Vitamin D per day.  If you are older than age 70, take 1200 mg of calcium and at least 800 mg of Vitamin D per day. Smoking and excessive alcohol intake increases the risk of osteoporosis. Eat foods rich in calcium and vitamin D and do weight bearing exercises several times a week as your caregiver suggests. DIABETES Diabetes Mellitus: If you have  type I or type 2 diabetes, you should keep your blood sugar under control with diet, exercise, and recommended medication. Avoid starchy and fatty foods, and too many sweets. Being overweight can make diabetes control more difficult. COGNITION AND MEMORY Cognition and Memory: Menopausal hormone therapy is not recommended for the prevention of cognitive disorders such as Alzheimer's disease or memory loss.  DEPRESSION  Depression may occur at any age, but it is common in elderly women. This may be because of physical, medical, social (loneliness), or financial problems and needs. If you are experiencing depression because of medical problems and control of symptoms, talk to your caregiver about this. Physical activity and exercise may help with mood and sleep. Community and volunteer involvement may improve your sense of value and worth. If you have depression and you feel that the problem is getting worse or becoming severe, talk to your caregiver about which treatment options are best for you. ACCIDENTS  Accidents are common and can be serious in elderly woman. Prepare your house to prevent accidents. Eliminate throw rugs, place hand bars in bath, shower, and toilet areas. Avoid wearing high heeled shoes or walking on wet, snowy, and icy areas. Limit or stop driving if you have vision or hearing problems, or if you feel you are unsteady with your movements and reflexes. HEPATITIS C Hepatitis C is a type of viral infection affecting the liver. It is spread mainly through contact with blood from an infected person. It can be treated, but if left untreated, it can lead to severe liver damage over the years. Many people who are infected do not know that the virus is in their blood. If you are a "baby-boomer", it is recommended that you have one screening test for Hepatitis C. IMMUNIZATIONS  Several immunizations are important to consider having during your senior years, including:   Tetanus, diphtheria,  and pertussis booster shot.  Influenza every year before the flu season begins.  Pneumonia vaccine.  Shingles vaccine.  Others, as indicated based on your specific needs. Talk to your caregiver about these. Document Released: 04/30/2005 Document Revised: 07/23/2013 Document Reviewed:   12/25/2007 ExitCare Patient Information 2015 [employer]. This information is not intended to replace advice given to you by your health care provider. Make sure you discuss any questions you have with your health care provider.  

## 2014-11-14 NOTE — Progress Notes (Signed)
Miranda Pierce Jun 12, 1950 [id number]    History:    Presents for breast and pelvic exam. Postmenopausal/no HRT/no bleeding. Normal mammogram history. CIN-1 greater than 20 years ago with normal Paps after. 2015 T score -2.5 left femoral neck with significant decrease started on  Fosamax, having some joint aches since. Depression, managed by psychiatrist counseling and meds. Negative colonoscopy 2010. Has had Zostavax. Lichen sclerosis asymptomatic.   Past medical history, past surgical history, family history and social history were all reviewed and documented in the EPIC chart. Lives in [place] with boyfriend, has 4 children who live here in [place] does visit often,has 10 grandchildren.   ROS:  A ROS was performed and pertinent positives and negatives are included.  Exam:  Filed Vitals:   11/14/14 0944  BP: 120/84    General appearance:  Normal Thyroid:  Symmetrical, normal in size, without palpable masses or nodularity. Respiratory  Auscultation:  Clear without wheezing or rhonchi, prominent sternum/pigeon chest Cardiovascular  Auscultation:  Regular rate, without rubs, murmurs or gallops  Edema/varicosities:  Not grossly evident Abdominal  Soft,nontender, without masses, guarding or rebound.  Liver/spleen:  No organomegaly noted  Hernia:  None appreciated  Skin  Inspection:  Grossly normal   Breasts: Examined lying and sitting.     Right: Without masses, retractions, discharge or axillary adenopathy.     Left: Without masses, retractions, discharge or axillary adenopathy. Gentitourinary   Inguinal/mons:  Normal without inguinal adenopathy  External genitalia:  Normal  BUS/Urethra/Skene's glands:  Normal  Vagina:  Normal  Cervix:  Normal  Uterus:  normal in size, shape and contour.  Midline and mobile  Adnexa/parametria:     Rt: Without masses or tenderness.   Lt: Without masses or tenderness.  Anus and perineum: Normal  Digital rectal exam: Normal sphincter tone  without palpated masses or tenderness  Assessment/Plan:  64 y.o. DWF G4P4 for breast and pelvic exam.  Postmenopausal/no bleeding/no HRT CIN-1 greater than 20 years ago with normal Paps after Osteoporosis on Fosamax since 11/2013 Asymptomatic lichen sclerosis Depression psychiatrist manages counseling and meds  Overactive bladder-declines medication  Plan: SBE's, continue annual 3-D screening mammogram history of dense breasts, increase regular exercise, is joining Silver sneakers if joint pain continues after increased exercise/stretching instructed to call. Fosamax 70 mg by mouth weekly prescription, proper use given and reviewed proper administration. Vitamin D 2000 daily encouraged. Home safety, fall prevention discussed. UA, Pap,  new screening guidelines reviewed.    Harrington Challenger [facility], 10:35 AM 11/14/2014

## 2014-11-15 LAB — URINALYSIS W MICROSCOPIC + REFLEX CULTURE
BACTERIA UA: NONE SEEN [HPF]
Casts: NONE SEEN [LPF]
Crystals: NONE SEEN [HPF]
GLUCOSE, UA: NEGATIVE
HGB URINE DIPSTICK: NEGATIVE
Ketones, ur: NEGATIVE
LEUKOCYTES UA: NEGATIVE
Nitrite: NEGATIVE
PROTEIN: NEGATIVE
RBC / HPF: NONE SEEN RBC/HPF (ref <=2)
Specific Gravity, Urine: 1.032 (ref 1.001–1.035)
WBC UA: NONE SEEN WBC/HPF (ref <=5)
Yeast: NONE SEEN [HPF]
pH: 6 (ref 5.0–8.0)

## 2014-11-15 LAB — CYTOLOGY - PAP

## 2014-11-20 ENCOUNTER — Other Ambulatory Visit: Payer: Self-pay

## 2014-11-20 DIAGNOSIS — Z1231 Encounter for screening mammogram for malignant neoplasm of breast: Secondary | ICD-10-CM

## 2014-11-20 DIAGNOSIS — M810 Age-related osteoporosis without current pathological fracture: Secondary | ICD-10-CM

## 2014-11-20 MED ORDER — ALENDRONATE SODIUM 70 MG PO TABS: 70.0000 mg | ORAL_TABLET | ORAL | Status: DC

## 2014-12-13 ENCOUNTER — Other Ambulatory Visit: Payer: Self-pay | Admitting: Gynecology

## 2014-12-17 ENCOUNTER — Telehealth: Payer: Self-pay | Admitting: *Deleted

## 2014-12-17 MED ORDER — ZOLPIDEM TARTRATE 10 MG PO TABS: 10.0000 mg | ORAL_TABLET | Freq: Every evening | ORAL | Status: DC | PRN

## 2014-12-17 NOTE — Telephone Encounter (Signed)
Pt informed with the below note,Rx called in.  

## 2014-12-17 NOTE — Telephone Encounter (Signed)
Okay, Ambien 10 mg at bedtime when necessary #30 with no refills. Review sleep hygiene, going to bed and getting up at about the same time daily, reading a book before bed as opposed to TV, best not to use daily.

## 2014-12-17 NOTE — Telephone Encounter (Signed)
Patient had annual on 11/14/14 spoke with you about taking Doxepin prescribed by a NP in [place]. Pt said this Rx is no longer helping with sleep asked if you would prescribe Ambien as she spoke with you about at OV. Please advise

## 2014-12-18 ENCOUNTER — Ambulatory Visit
Admission: RE | Admit: 2014-12-18 | Discharge: 2014-12-18 | Disposition: A | Discharge status: Home / Self Care | Payer: Medicare Other | Source: Ambulatory Visit

## 2014-12-18 DIAGNOSIS — Z1231 Encounter for screening mammogram for malignant neoplasm of breast: Secondary | ICD-10-CM

## 2014-12-24 ENCOUNTER — Encounter: Payer: Self-pay | Admitting: Women's Health

## 2015-02-26 ENCOUNTER — Telehealth: Payer: Self-pay | Admitting: *Deleted

## 2015-02-26 MED ORDER — ZOLPIDEM TARTRATE 10 MG PO TABS: 10.0000 mg | ORAL_TABLET | Freq: Every evening | ORAL | Status: DC | PRN

## 2015-02-26 NOTE — Telephone Encounter (Signed)
Pt called requesting refill on Ambien 10 mg last filled on 12/17/14. Please advise

## 2015-02-26 NOTE — Telephone Encounter (Signed)
Rx called in 

## 2015-02-26 NOTE — Telephone Encounter (Signed)
Okay for refill of Ambien 10 daily at bedtime when necessary #30 with 1 refill

## 2015-11-18 ENCOUNTER — Ambulatory Visit (INDEPENDENT_AMBULATORY_CARE_PROVIDER_SITE_OTHER): Payer: Medicare Other | Admitting: Women's Health

## 2015-11-18 ENCOUNTER — Encounter: Payer: Self-pay | Admitting: Women's Health

## 2015-11-18 VITALS — BP 124/80 | Ht 63.0 in | Wt 153.0 lb

## 2015-11-18 DIAGNOSIS — M810 Age-related osteoporosis without current pathological fracture: Secondary | ICD-10-CM | POA: Diagnosis not present

## 2015-11-18 DIAGNOSIS — Z13820 Encounter for screening for osteoporosis: Secondary | ICD-10-CM | POA: Diagnosis not present

## 2015-11-18 DIAGNOSIS — Z01419 Encounter for gynecological examination (general) (routine) without abnormal findings: Secondary | ICD-10-CM | POA: Diagnosis not present

## 2015-11-18 NOTE — Progress Notes (Signed)
Miranda SchwartzKaren L Pierce [birth year] [id number]    History:    Presents for breast and pelvic exam. CIN-1 greater than 20 years ago with normal Paps after. Normal mammogram history. Has had Zostavax and Pneumovax. 10/2013 T score -2.5 was started on Fosamax 11/2013 only tolerated several months and then stopped. No history of a fracture. 2010 negative colonoscopy. Same partner. Anxiety and depression stable primary care managing labs.   Past medical history, past surgical history, family history and social history were all reviewed and documented in the EPIC chart. Lives in [name] with long-term boyfriend. [place] 4 [name] has 10 grandchildren.  ROS:  A ROS was performed and pertinent positives and negatives are included.  Exam:  Vitals:   11/18/15 0905  BP: 124/80  Weight: 153 lb (69.4 kg)  Height: 5\' 3"  (1.6 m)   Body mass index is 27.1 kg/m.   General appearance:  Normal Thyroid:  Symmetrical, normal in size, without palpable masses or nodularity. Respiratory  Auscultation:  Clear without wheezing or rhonchi Cardiovascular  Auscultation:  Regular rate, without rubs, murmurs or gallops  Edema/varicosities:  Not grossly evident Abdominal  Soft,nontender, without masses, guarding or rebound.  Liver/spleen:  No organomegaly noted  Hernia:  None appreciated  Skin  Inspection:  Grossly normal   Breasts: Examined lying and sitting.     Right: Without masses, retractions, discharge or axillary adenopathy.     Left: Without masses, retractions, discharge or axillary adenopathy. Gentitourinary   Inguinal/mons:  Normal without inguinal adenopathy  External genitalia:  lichen sclerosis at clitoral hood  BUS/Urethra/Skene's glands:  Normal  Vagina:  Normal  Cervix:  Normal  Uterus:  normal in size, shape and contour.  Midline and mobile  Adnexa/parametria:     Rt: Without masses or tenderness.   Lt: Without masses or tenderness.  Anus and  perineum: Normal  Digital rectal exam: Normal sphincter tone without palpated masses or tenderness  Assessment/Plan:  65 y.o. D WF G4 P4 for breast and pelvic exam with no complaints.  Postmenopausal on no HRT Osteoporosis. Fosamax not tolerated Anxiety and depression-stable on meds Labs-primary care Lichen sclerosis-asymptomatic  Plan: SBE's, continue annual 3-D screening mammogram history of dense breasts. Exercise, calcium rich diet, continue calcium and vitamin D supplement. Repeat DEXA will make plan after. Home safety, fall prevention and importance of weightbearing and balance type exercise reviewed. Leisure activities, self-care reviewed importance. Pap normal 2016, new screening guidelines reviewed.  Miranda Pierce [facility]NP, 10:19 AM 11/18/2015

## 2015-11-18 NOTE — Patient Instructions (Signed)

## 2015-11-19 LAB — URINALYSIS W MICROSCOPIC + REFLEX CULTURE
CASTS: NONE SEEN [LPF]
CRYSTALS: NONE SEEN [HPF]
GLUCOSE, UA: NEGATIVE
Hgb urine dipstick: NEGATIVE
KETONES UR: NEGATIVE
Leukocytes, UA: NEGATIVE
Nitrite: NEGATIVE
Specific Gravity, Urine: 1.025 (ref 1.001–1.035)
Yeast: NONE SEEN [HPF]
pH: 6 (ref 5.0–8.0)

## 2015-11-20 LAB — URINE CULTURE: Organism ID, Bacteria: 10000

## 2015-11-25 ENCOUNTER — Other Ambulatory Visit: Payer: Self-pay | Admitting: Women's Health

## 2015-11-25 DIAGNOSIS — Z1231 Encounter for screening mammogram for malignant neoplasm of breast: Secondary | ICD-10-CM

## 2015-12-16 ENCOUNTER — Other Ambulatory Visit: Payer: Self-pay | Admitting: Gynecology

## 2015-12-16 DIAGNOSIS — M810 Age-related osteoporosis without current pathological fracture: Secondary | ICD-10-CM

## 2015-12-29 ENCOUNTER — Ambulatory Visit
Admission: RE | Admit: 2015-12-29 | Discharge: 2015-12-29 | Disposition: A | Discharge status: Home / Self Care | Payer: Medicare Other | Source: Ambulatory Visit | Attending: Women's Health | Admitting: Women's Health

## 2015-12-29 DIAGNOSIS — Z1231 Encounter for screening mammogram for malignant neoplasm of breast: Secondary | ICD-10-CM

## 2015-12-30 ENCOUNTER — Ambulatory Visit (INDEPENDENT_AMBULATORY_CARE_PROVIDER_SITE_OTHER): Payer: Medicare Other

## 2015-12-30 DIAGNOSIS — M810 Age-related osteoporosis without current pathological fracture: Secondary | ICD-10-CM

## 2016-01-01 ENCOUNTER — Encounter: Payer: Self-pay | Admitting: Women's Health

## 2016-01-05 ENCOUNTER — Encounter: Payer: Self-pay | Admitting: Gynecology

## 2016-11-29 ENCOUNTER — Other Ambulatory Visit: Payer: Self-pay | Admitting: Women's Health

## 2016-11-29 DIAGNOSIS — Z1231 Encounter for screening mammogram for malignant neoplasm of breast: Secondary | ICD-10-CM

## 2016-11-30 ENCOUNTER — Encounter: Payer: Self-pay | Admitting: Women's Health

## 2016-11-30 ENCOUNTER — Ambulatory Visit (INDEPENDENT_AMBULATORY_CARE_PROVIDER_SITE_OTHER): Payer: Medicare Other | Admitting: Women's Health

## 2016-11-30 VITALS — BP 123/84 | Ht 61.0 in | Wt 126.0 lb

## 2016-11-30 DIAGNOSIS — Z01419 Encounter for gynecological examination (general) (routine) without abnormal findings: Secondary | ICD-10-CM | POA: Diagnosis not present

## 2016-11-30 NOTE — Patient Instructions (Signed)
Health Maintenance for Postmenopausal Women Menopause is a normal process in which your reproductive ability comes to an end. This process happens gradually over a span of months to years, usually between the ages of 22 and 9. Menopause is complete when you have missed 12 consecutive menstrual periods. It is important to talk with your health care provider about some of the most common conditions that affect postmenopausal women, such as heart disease, cancer, and bone loss (osteoporosis). Adopting a healthy lifestyle and getting preventive care can help to promote your health and wellness. Those actions can also lower your chances of developing some of these common conditions. What should I know about menopause? During menopause, you may experience a number of symptoms, such as:  Moderate-to-severe hot flashes.  Night sweats.  Decrease in sex drive.  Mood swings.  Headaches.  Tiredness.  Irritability.  Memory problems.  Insomnia.  Choosing to treat or not to treat menopausal changes is an individual decision that you make with your health care provider. What should I know about hormone replacement therapy and supplements? Hormone therapy products are effective for treating symptoms that are associated with menopause, such as hot flashes and night sweats. Hormone replacement carries certain risks, especially as you become older. If you are thinking about using estrogen or estrogen with progestin treatments, discuss the benefits and risks with your health care provider. What should I know about heart disease and stroke? Heart disease, heart attack, and stroke become more likely as you age. This may be due, in part, to the hormonal changes that your body experiences during menopause. These can affect how your body processes dietary fats, triglycerides, and cholesterol. Heart attack and stroke are both medical emergencies. There are many things that you can do to help prevent heart disease  and stroke:  Have your blood pressure checked at least every 1-2 years. High blood pressure causes heart disease and increases the risk of stroke.  If you are 53-22 years old, ask your health care provider if you should take aspirin to prevent a heart attack or a stroke.  Do not use any tobacco products, including cigarettes, chewing tobacco, or electronic cigarettes. If you need help quitting, ask your health care provider.  It is important to eat a healthy diet and maintain a healthy weight. ? Be sure to include plenty of vegetables, fruits, low-fat dairy products, and lean protein. ? Avoid eating foods that are high in solid fats, added sugars, or salt (sodium).  Get regular exercise. This is one of the most important things that you can do for your health. ? Try to exercise for at least 150 minutes each week. The type of exercise that you do should increase your heart rate and make you sweat. This is known as moderate-intensity exercise. ? Try to do strengthening exercises at least twice each week. Do these in addition to the moderate-intensity exercise.  Know your numbers.Ask your health care provider to check your cholesterol and your blood glucose. Continue to have your blood tested as directed by your health care provider.  What should I know about cancer screening? There are several types of cancer. Take the following steps to reduce your risk and to catch any cancer development as early as possible. Breast Cancer  Practice breast self-awareness. ? This means understanding how your breasts normally appear and feel. ? It also means doing regular breast self-exams. Let your health care provider know about any changes, no matter how small.  If you are 40  or older, have a clinician do a breast exam (clinical breast exam or CBE) every year. Depending on your age, family history, and medical history, it may be recommended that you also have a yearly breast X-ray (mammogram).  If you  have a family history of breast cancer, talk with your health care provider about genetic screening.  If you are at high risk for breast cancer, talk with your health care provider about having an MRI and a mammogram every year.  Breast cancer (BRCA) gene test is recommended for women who have family members with BRCA-related cancers. Results of the assessment will determine the need for genetic counseling and BRCA1 and for BRCA2 testing. BRCA-related cancers include these types: ? Breast. This occurs in males or females. ? Ovarian. ? Tubal. This may also be called fallopian tube cancer. ? Cancer of the abdominal or pelvic lining (peritoneal cancer). ? Prostate. ? Pancreatic.  Cervical, Uterine, and Ovarian Cancer Your health care provider may recommend that you be screened regularly for cancer of the pelvic organs. These include your ovaries, uterus, and vagina. This screening involves a pelvic exam, which includes checking for microscopic changes to the surface of your cervix (Pap test).  For women ages 21-65, health care providers may recommend a pelvic exam and a Pap test every three years. For women ages 79-65, they may recommend the Pap test and pelvic exam, combined with testing for human papilloma virus (HPV), every five years. Some types of HPV increase your risk of cervical cancer. Testing for HPV may also be done on women of any age who have unclear Pap test results.  Other health care providers may not recommend any screening for nonpregnant women who are considered low risk for pelvic cancer and have no symptoms. Ask your health care provider if a screening pelvic exam is right for you.  If you have had past treatment for cervical cancer or a condition that could lead to cancer, you need Pap tests and screening for cancer for at least 20 years after your treatment. If Pap tests have been discontinued for you, your risk factors (such as having a new sexual partner) need to be  reassessed to determine if you should start having screenings again. Some women have medical problems that increase the chance of getting cervical cancer. In these cases, your health care provider may recommend that you have screening and Pap tests more often.  If you have a family history of uterine cancer or ovarian cancer, talk with your health care provider about genetic screening.  If you have vaginal bleeding after reaching menopause, tell your health care provider.  There are currently no reliable tests available to screen for ovarian cancer.  Lung Cancer Lung cancer screening is recommended for adults 69-62 years old who are at high risk for lung cancer because of a history of smoking. A yearly low-dose CT scan of the lungs is recommended if you:  Currently smoke.  Have a history of at least 30 pack-years of smoking and you currently smoke or have quit within the past 15 years. A pack-year is smoking an average of one pack of cigarettes per day for one year.  Yearly screening should:  Continue until it has been 15 years since you quit.  Stop if you develop a health problem that would prevent you from having lung cancer treatment.  Colorectal Cancer  This type of cancer can be detected and can often be prevented.  Routine colorectal cancer screening usually begins at  age 42 and continues through age 45.  If you have risk factors for colon cancer, your health care provider may recommend that you be screened at an earlier age.  If you have a family history of colorectal cancer, talk with your health care provider about genetic screening.  Your health care provider may also recommend using home test kits to check for hidden blood in your stool.  A small camera at the end of a tube can be used to examine your colon directly (sigmoidoscopy or colonoscopy). This is done to check for the earliest forms of colorectal cancer.  Direct examination of the colon should be repeated every  5-10 years until age 71. However, if early forms of precancerous polyps or small growths are found or if you have a family history or genetic risk for colorectal cancer, you may need to be screened more often.  Skin Cancer  Check your skin from head to toe regularly.  Monitor any moles. Be sure to tell your health care provider: ? About any new moles or changes in moles, especially if there is a change in a mole's shape or color. ? If you have a mole that is larger than the size of a pencil eraser.  If any of your family members has a history of skin cancer, especially at a Kammie Scioli age, talk with your health care provider about genetic screening.  Always use sunscreen. Apply sunscreen liberally and repeatedly throughout the day.  Whenever you are outside, protect yourself by wearing long sleeves, pants, a wide-brimmed hat, and sunglasses.  What should I know about osteoporosis? Osteoporosis is a condition in which bone destruction happens more quickly than new bone creation. After menopause, you may be at an increased risk for osteoporosis. To help prevent osteoporosis or the bone fractures that can happen because of osteoporosis, the following is recommended:  If you are 46-71 years old, get at least 1,000 mg of calcium and at least 600 mg of vitamin D per day.  If you are older than age 55 but younger than age 65, get at least 1,200 mg of calcium and at least 600 mg of vitamin D per day.  If you are older than age 54, get at least 1,200 mg of calcium and at least 800 mg of vitamin D per day.  Smoking and excessive alcohol intake increase the risk of osteoporosis. Eat foods that are rich in calcium and vitamin D, and do weight-bearing exercises several times each week as directed by your health care provider. What should I know about how menopause affects my mental health? Depression may occur at any age, but it is more common as you become older. Common symptoms of depression  include:  Low or sad mood.  Changes in sleep patterns.  Changes in appetite or eating patterns.  Feeling an overall lack of motivation or enjoyment of activities that you previously enjoyed.  Frequent crying spells.  Talk with your health care provider if you think that you are experiencing depression. What should I know about immunizations? It is important that you get and maintain your immunizations. These include:  Tetanus, diphtheria, and pertussis (Tdap) booster vaccine.  Influenza every year before the flu season begins.  Pneumonia vaccine.  Shingles vaccine.  Your health care provider may also recommend other immunizations. This information is not intended to replace advice given to you by your health care provider. Make sure you discuss any questions you have with your health care provider. Document Released: 04/30/2005  Document Revised: 09/26/2015 Document Reviewed: 12/10/2014 Elsevier Interactive Patient Education  [employer].

## 2016-11-30 NOTE — Progress Notes (Signed)
Leonard SchwartzKaren L Yono 1950/05/20 [id number]    History:    Presents for breast and pelvic exam with no complaints. Postmenopausal on no HRT with no bleeding. Normal  Mammogram history. History of CIN-1 greater than 20 years ago with normal Paps after. 2010 negative colonoscopy. Primary care manages anxiety. Vaccines current. 12/2015 T score -2.2 at left hip all sites were better. Has lost 25 pounds in the past year with diet and exercise.   Past medical history, past surgical history, family history and social history were all reviewed and documented in the EPIC chart[place] Has 4 children all doing well.  ROS:  A ROS was performed and pertinent positives and negatives are included.  Exam:  Vitals:   11/30/16 1136  BP: 123/84  Weight: 126 lb (57.2 kg)  Height: 5\' 1"  (1.549 m)   Body mass index is 23.81 kg/m.   General appearance:  Normal Thyroid:  Symmetrical, normal in size, without palpable masses or nodularity. Respiratory  Auscultation:  Clear without wheezing or rhonchi Cardiovascular  Auscultation:  Regular rate, without rubs, murmurs or gallops  Edema/varicosities:  Not grossly evident Abdominal  Soft,nontender, without masses, guarding or rebound.  Liver/spleen:  No organomegaly noted  Hernia:  None appreciated  Skin  Inspection:  Grossly normal   Breasts: Examined lying and sitting.     Right: Without masses, retractions, discharge or axillary adenopathy.     Left: Without masses, retractions, discharge or axillary adenopathy. Gentitourinary   Inguinal/mons:  Normal without inguinal adenopathy  External genitalia:  Normal  BUS/Urethra/Skene's glands:  Normal  Vagina:  Mild atrophy  Cervix:  Normal  Uterus:   normal in size, shape and contour.  Midline and mobile  Adnexa/parametria:     Rt: Without masses or tenderness.   Lt: Without masses or tenderness.  Anus and perineum: Normal  Digital rectal exam: Normal sphincter tone without palpated  masses or tenderness  Assessment/Plan:  66 y.o. D WF G4 P4 for breast and pelvic exam with no complaints.  Postmenopausal/no HRT/no bleeding/mild vaginal atrophy CIN-1 greater than 20 years ago normal Paps after Anxiety and depression-primary care manages labs and meds Osteopenia without elevated FRAX  Plan: SBE's, continue annual screening mammogram, calcium rich diet, vitamin D 2000 daily encouraged. Reviewed importance of weightbearing exercise, home safety, fall prevention discussed. Pap normal 2016, new screening guidelines reviewed.    Harrington Challengerancy J Avaneesh Pepitone [facility]WHNP, 12:02 PM 11/30/2016

## 2017-02-07 ENCOUNTER — Ambulatory Visit
Admission: RE | Admit: 2017-02-07 | Discharge: 2017-02-07 | Disposition: A | Discharge status: Home / Self Care | Payer: Medicare Other | Source: Ambulatory Visit | Attending: Women's Health | Admitting: Women's Health

## 2017-02-07 DIAGNOSIS — Z1231 Encounter for screening mammogram for malignant neoplasm of breast: Secondary | ICD-10-CM

## 2017-02-08 ENCOUNTER — Encounter: Payer: Self-pay | Admitting: Women's Health

## 2017-02-09 ENCOUNTER — Ambulatory Visit: Payer: Medicare Other

## 2017-06-01 ENCOUNTER — Ambulatory Visit: Payer: Medicare Other | Admitting: Women's Health

## 2017-06-01 ENCOUNTER — Encounter: Payer: Self-pay | Admitting: Women's Health

## 2017-06-01 DIAGNOSIS — N898 Other specified noninflammatory disorders of vagina: Secondary | ICD-10-CM

## 2017-06-01 DIAGNOSIS — B009 Herpesviral infection, unspecified: Secondary | ICD-10-CM | POA: Diagnosis not present

## 2017-06-01 DIAGNOSIS — R300 Dysuria: Secondary | ICD-10-CM

## 2017-06-01 LAB — WET PREP FOR TRICH, YEAST, CLUE

## 2017-06-01 MED ORDER — ACYCLOVIR 200 MG PO CAPS: 200.0000 mg | ORAL_CAPSULE | Freq: Every day | ORAL | 6 refills | Status: AC

## 2017-06-01 MED ORDER — VALACYCLOVIR HCL 500 MG PO TABS: 500.0000 mg | ORAL_TABLET | Freq: Two times a day (BID) | ORAL | 12 refills | Status: DC

## 2017-06-01 NOTE — Progress Notes (Addendum)
66-y.o SWF presents with complaints of vaginal and anal burning started 2-3 days ago. Symptoms get worse with urination and wiping. Reports external genitalia pumps, erythema, mild itching, and pain with intercourse. Denies abdominal pain, fever, or vomiting. Sexually active with same partner, questionable fidelity. Had intercourse a week ago. Currently living with her daughter.postmenopausal/no HRT/no  bleeding.  Exam: External genitalia, severe erythema at introitus, three 0.1 cm whitish tender bumps. HSV cultures done. Speculum exam, inflamed cervix and vaginal wall, reddish discharge, no odor noted. Wet prep, negative. UA, negative leukocytes, +1 blood, 0-5 WBCs, 3-10 RBCs, few bacteria.     Probable HSV  Plan: Acyclovir 200 mg PO 5 times daily for 3 days or Valacyclovir 500 mg, PO, BID for 3-5 days PRN. Instructed to take one or the other, not both. (unsure of insurance coverage for drugs) Discussed HSV/STD prevention. Call if continued problems. Declines HIV, hepatitis or RPR. Reviewed importance of condoms. GC/Chlamydia culture and HSV pending. urine culture pending.

## 2017-06-01 NOTE — Patient Instructions (Signed)
Only take either or valtrex or acyclovir   NOT  Both  Genital Herpes Genital herpes is a common sexually transmitted infection (STI) that is caused by a virus. The virus spreads from person to person through sexual contact. Infection can cause itching, blisters, and sores around the genitals or rectum. Symptoms may last several days and then go away This is called an outbreak. However, the virus remains in your body, so you may have more outbreaks in the future. The time between outbreaks varies and can be months or years. Genital herpes affects men and women. It is particularly concerning for pregnant women because the virus can be passed to the baby during delivery and can cause serious problems. Genital herpes is also a concern for people who have a weak disease-fighting (immune) system. What are the causes? This condition is caused by the herpes simplex virus (HSV) type 1 or type 2. The virus may spread through:  Sexual contact with an infected person, including vaginal, anal, and oral sex.  Contact with fluid from a herpes sore.  The skin. This means that you can get herpes from an infected partner even if he or she does not have a visible sore or does not know that he or she is infected.  What increases the risk? You are more likely to develop this condition if:  You have sex with many partners.  You do not use latex condoms during sex.  What are the signs or symptoms? Most people do not have symptoms (asymptomatic) or have mild symptoms that may be mistaken for other skin problems. Symptoms may include:  Small red bumps near the genitals, rectum, or mouth. These bumps turn into blisters and then turn into sores.  Flu-like symptoms, including: ? Fever. ? Body aches. ? Swollen lymph nodes. ? Headache.  Painful urination.  Pain and itching in the genital area or rectal area.  Vaginal discharge.  Tingling or shooting pain in the legs and buttocks.  Generally, symptoms are  more severe and last longer during the first (primary) outbreak. Flu-like symptoms are also more common during the primary outbreak. How is this diagnosed? Genital herpes may be diagnosed based on:  A physical exam.  Your medical history.  Blood tests.  A test of a fluid sample (culture) from an open sore.  How is this treated? There is no cure for this condition, but treatment with antiviral medicines that are taken by mouth (orally) can do the following:  Speed up healing and relieve symptoms.  Help to reduce the spread of the virus to sexual partners.  Limit the chance of future outbreaks, or make future outbreaks shorter.  Lessen symptoms of future outbreaks.  Your health care provider may also recommend pain relief medicines, such as aspirin or ibuprofen. Follow these instructions at home: Sexual activity  Do not have sexual contact during active outbreaks.  Practice safe sex. Latex condoms and female condoms may help prevent the spread of the herpes virus. General instructions  Keep the affected areas dry and clean.  Take over-the-counter and prescription medicines only as told by your health care provider.  Avoid rubbing or touching blisters and sores. If you do touch blisters or sores: ? Wash your hands thoroughly with soap and water. ? Do not touch your eyes afterward.  To help relieve pain or itching, you may take the following actions as directed by your health care provider: ? Apply a cold, wet cloth (cold compress) to affected areas 4-6 times a  day. ? Apply a substance that protects your skin and reduces bleeding (astringent). ? Apply a gel that helps relieve pain around sores (lidocaine gel). ? Take a warm, shallow bath that cleans the genital area (sitz bath).  Keep all follow-up visits as told by your health care provider. This is important. How is this prevented?  Use condoms. Although anyone can get genital herpes during sexual contact, even with the  use of a condom, a condom can provide some protection.  Avoid having multiple sexual partners.  Talk with your sexual partner about any symptoms either of you may have. Also, talk with your partner about any history of STIs.  Get tested for STIs before you have sex. Ask your partner to do the same.  Do not have sexual contact if you have symptoms of genital herpes. Contact a health care provider if:  Your symptoms are not improving with medicine.  Your symptoms return.  You have new symptoms.  You have a fever.  You have abdominal pain.  You have redness, swelling, or pain in your eye.  You notice new sores on other parts of your body.  You are a woman and experience bleeding between menstrual periods.  You have had herpes and you become pregnant or plan to become pregnant. Summary  Genital herpes is a common sexually transmitted infection (STI) that is caused by the herpes simplex virus (HSV) type 1 or type 2.  These viruses are most often spread through sexual contact with an infected person.  You are more likely to develop this condition if you have sex with many partners or you have unprotected sex.  Most people do not have symptoms (asymptomatic) or have mild symptoms that may be mistaken for other skin problems. Symptoms occur as outbreaks that may happen months or years apart.  There is no cure for this condition, but treatment with oral antiviral medicines can reduce symptoms, reduce the chance of spreading the virus to a partner, prevent future outbreaks, or shorten future outbreaks. This information is not intended to replace advice given to you by your health care provider. Make sure you discuss any questions you have with your health care provider. Document Released: 03/05/2000 Document Revised: 02/06/2016 Document Reviewed: 02/06/2016 Elsevier Interactive Patient Education  [employer]2018 [employer]

## 2017-06-02 LAB — TIQ-NTM

## 2017-06-02 LAB — C. TRACHOMATIS/N. GONORRHOEAE RNA
C. trachomatis RNA, TMA: NOT DETECTED
N. gonorrhoeae RNA, TMA: NOT DETECTED

## 2017-06-03 LAB — URINALYSIS, COMPLETE W/RFL CULTURE
BILIRUBIN URINE: NEGATIVE
Glucose, UA: NEGATIVE
HYALINE CAST: NONE SEEN /LPF
KETONES UR: NEGATIVE
Leukocyte Esterase: NEGATIVE
Nitrites, Initial: NEGATIVE
PROTEIN: NEGATIVE
Specific Gravity, Urine: 1.025 (ref 1.001–1.03)
pH: 5.5 (ref 5.0–8.0)

## 2017-06-03 LAB — URINE CULTURE
MICRO NUMBER: 90324827
SPECIMEN QUALITY: ADEQUATE

## 2017-06-03 LAB — CULTURE INDICATED

## 2017-06-06 ENCOUNTER — Encounter: Payer: Self-pay | Admitting: Women's Health

## 2017-06-08 ENCOUNTER — Encounter: Payer: Self-pay | Admitting: Women's Health

## 2017-06-08 DIAGNOSIS — B009 Herpesviral infection, unspecified: Secondary | ICD-10-CM | POA: Insufficient documentation

## 2017-06-08 LAB — HERPES SIMPLEX VIRUS CULTURE

## 2017-06-08 LAB — SURESWAB HSV, TYPE 1/2 DNA, PCR
HSV 1 DNA: DETECTED — AB
HSV 2 DNA: NOT DETECTED

## 2017-12-26 ENCOUNTER — Encounter: Payer: Medicare Other | Admitting: Women's Health

## 2018-02-14 ENCOUNTER — Ambulatory Visit: Payer: Medicare Other | Admitting: Women's Health

## 2018-02-14 ENCOUNTER — Encounter: Payer: Self-pay | Admitting: Women's Health

## 2018-02-14 VITALS — BP 134/74 | Ht 61.3 in | Wt 126.2 lb

## 2018-02-14 DIAGNOSIS — Z01419 Encounter for gynecological examination (general) (routine) without abnormal findings: Secondary | ICD-10-CM

## 2018-02-14 DIAGNOSIS — Z13820 Encounter for screening for osteoporosis: Secondary | ICD-10-CM

## 2018-02-14 DIAGNOSIS — E2839 Other primary ovarian failure: Secondary | ICD-10-CM

## 2018-02-14 NOTE — Patient Instructions (Signed)
Health Maintenance for Postmenopausal Women Menopause is a normal process in which your reproductive ability comes to an end. This process happens gradually over a span of months to years, usually between the ages of 22 and 9. Menopause is complete when you have missed 12 consecutive menstrual periods. It is important to talk with your health care provider about some of the most common conditions that affect postmenopausal women, such as heart disease, cancer, and bone loss (osteoporosis). Adopting a healthy lifestyle and getting preventive care can help to promote your health and wellness. Those actions can also lower your chances of developing some of these common conditions. What should I know about menopause? During menopause, you may experience a number of symptoms, such as:  Moderate-to-severe hot flashes.  Night sweats.  Decrease in sex drive.  Mood swings.  Headaches.  Tiredness.  Irritability.  Memory problems.  Insomnia.  Choosing to treat or not to treat menopausal changes is an individual decision that you make with your health care provider. What should I know about hormone replacement therapy and supplements? Hormone therapy products are effective for treating symptoms that are associated with menopause, such as hot flashes and night sweats. Hormone replacement carries certain risks, especially as you become older. If you are thinking about using estrogen or estrogen with progestin treatments, discuss the benefits and risks with your health care provider. What should I know about heart disease and stroke? Heart disease, heart attack, and stroke become more likely as you age. This may be due, in part, to the hormonal changes that your body experiences during menopause. These can affect how your body processes dietary fats, triglycerides, and cholesterol. Heart attack and stroke are both medical emergencies. There are many things that you can do to help prevent heart disease  and stroke:  Have your blood pressure checked at least every 1-2 years. High blood pressure causes heart disease and increases the risk of stroke.  If you are 53-22 years old, ask your health care provider if you should take aspirin to prevent a heart attack or a stroke.  Do not use any tobacco products, including cigarettes, chewing tobacco, or electronic cigarettes. If you need help quitting, ask your health care provider.  It is important to eat a healthy diet and maintain a healthy weight. ? Be sure to include plenty of vegetables, fruits, low-fat dairy products, and lean protein. ? Avoid eating foods that are high in solid fats, added sugars, or salt (sodium).  Get regular exercise. This is one of the most important things that you can do for your health. ? Try to exercise for at least 150 minutes each week. The type of exercise that you do should increase your heart rate and make you sweat. This is known as moderate-intensity exercise. ? Try to do strengthening exercises at least twice each week. Do these in addition to the moderate-intensity exercise.  Know your numbers.Ask your health care provider to check your cholesterol and your blood glucose. Continue to have your blood tested as directed by your health care provider.  What should I know about cancer screening? There are several types of cancer. Take the following steps to reduce your risk and to catch any cancer development as early as possible. Breast Cancer  Practice breast self-awareness. ? This means understanding how your breasts normally appear and feel. ? It also means doing regular breast self-exams. Let your health care provider know about any changes, no matter how small.  If you are 40  or older, have a clinician do a breast exam (clinical breast exam or CBE) every year. Depending on your age, family history, and medical history, it may be recommended that you also have a yearly breast X-ray (mammogram).  If you  have a family history of breast cancer, talk with your health care provider about genetic screening.  If you are at high risk for breast cancer, talk with your health care provider about having an MRI and a mammogram every year.  Breast cancer (BRCA) gene test is recommended for women who have family members with BRCA-related cancers. Results of the assessment will determine the need for genetic counseling and BRCA1 and for BRCA2 testing. BRCA-related cancers include these types: ? Breast. This occurs in males or females. ? Ovarian. ? Tubal. This may also be called fallopian tube cancer. ? Cancer of the abdominal or pelvic lining (peritoneal cancer). ? Prostate. ? Pancreatic.  Cervical, Uterine, and Ovarian Cancer Your health care provider may recommend that you be screened regularly for cancer of the pelvic organs. These include your ovaries, uterus, and vagina. This screening involves a pelvic exam, which includes checking for microscopic changes to the surface of your cervix (Pap test).  For women ages 21-65, health care providers may recommend a pelvic exam and a Pap test every three years. For women ages 79-65, they may recommend the Pap test and pelvic exam, combined with testing for human papilloma virus (HPV), every five years. Some types of HPV increase your risk of cervical cancer. Testing for HPV may also be done on women of any age who have unclear Pap test results.  Other health care providers may not recommend any screening for nonpregnant women who are considered low risk for pelvic cancer and have no symptoms. Ask your health care provider if a screening pelvic exam is right for you.  If you have had past treatment for cervical cancer or a condition that could lead to cancer, you need Pap tests and screening for cancer for at least 20 years after your treatment. If Pap tests have been discontinued for you, your risk factors (such as having a new sexual partner) need to be  reassessed to determine if you should start having screenings again. Some women have medical problems that increase the chance of getting cervical cancer. In these cases, your health care provider may recommend that you have screening and Pap tests more often.  If you have a family history of uterine cancer or ovarian cancer, talk with your health care provider about genetic screening.  If you have vaginal bleeding after reaching menopause, tell your health care provider.  There are currently no reliable tests available to screen for ovarian cancer.  Lung Cancer Lung cancer screening is recommended for adults 69-62 years old who are at high risk for lung cancer because of a history of smoking. A yearly low-dose CT scan of the lungs is recommended if you:  Currently smoke.  Have a history of at least 30 pack-years of smoking and you currently smoke or have quit within the past 15 years. A pack-year is smoking an average of one pack of cigarettes per day for one year.  Yearly screening should:  Continue until it has been 15 years since you quit.  Stop if you develop a health problem that would prevent you from having lung cancer treatment.  Colorectal Cancer  This type of cancer can be detected and can often be prevented.  Routine colorectal cancer screening usually begins at  age 42 and continues through age 45.  If you have risk factors for colon cancer, your health care provider may recommend that you be screened at an earlier age.  If you have a family history of colorectal cancer, talk with your health care provider about genetic screening.  Your health care provider may also recommend using home test kits to check for hidden blood in your stool.  A small camera at the end of a tube can be used to examine your colon directly (sigmoidoscopy or colonoscopy). This is done to check for the earliest forms of colorectal cancer.  Direct examination of the colon should be repeated every  5-10 years until age 71. However, if early forms of precancerous polyps or small growths are found or if you have a family history or genetic risk for colorectal cancer, you may need to be screened more often.  Skin Cancer  Check your skin from head to toe regularly.  Monitor any moles. Be sure to tell your health care provider: ? About any new moles or changes in moles, especially if there is a change in a mole's shape or color. ? If you have a mole that is larger than the size of a pencil eraser.  If any of your family members has a history of skin cancer, especially at a Delores Edelstein age, talk with your health care provider about genetic screening.  Always use sunscreen. Apply sunscreen liberally and repeatedly throughout the day.  Whenever you are outside, protect yourself by wearing long sleeves, pants, a wide-brimmed hat, and sunglasses.  What should I know about osteoporosis? Osteoporosis is a condition in which bone destruction happens more quickly than new bone creation. After menopause, you may be at an increased risk for osteoporosis. To help prevent osteoporosis or the bone fractures that can happen because of osteoporosis, the following is recommended:  If you are 46-71 years old, get at least 1,000 mg of calcium and at least 600 mg of vitamin D per day.  If you are older than age 55 but younger than age 65, get at least 1,200 mg of calcium and at least 600 mg of vitamin D per day.  If you are older than age 54, get at least 1,200 mg of calcium and at least 800 mg of vitamin D per day.  Smoking and excessive alcohol intake increase the risk of osteoporosis. Eat foods that are rich in calcium and vitamin D, and do weight-bearing exercises several times each week as directed by your health care provider. What should I know about how menopause affects my mental health? Depression may occur at any age, but it is more common as you become older. Common symptoms of depression  include:  Low or sad mood.  Changes in sleep patterns.  Changes in appetite or eating patterns.  Feeling an overall lack of motivation or enjoyment of activities that you previously enjoyed.  Frequent crying spells.  Talk with your health care provider if you think that you are experiencing depression. What should I know about immunizations? It is important that you get and maintain your immunizations. These include:  Tetanus, diphtheria, and pertussis (Tdap) booster vaccine.  Influenza every year before the flu season begins.  Pneumonia vaccine.  Shingles vaccine.  Your health care provider may also recommend other immunizations. This information is not intended to replace advice given to you by your health care provider. Make sure you discuss any questions you have with your health care provider. Document Released: 04/30/2005  Document Revised: 09/26/2015 Document Reviewed: 12/10/2014 Elsevier Interactive Patient Education  2018 [employer]  

## 2018-02-14 NOTE — Progress Notes (Signed)
Leonard SchwartzKaren L Reasoner 03/14/1951 [id number]    History:    Presents for breast and pelvic exam.   Postmenopausal on no HRT with no bleeding.  Not sexually active.  Normal mammogram history.  History of an abnormal Pap greater than 20 years ago with normal Paps after.  12/2015 T score -2.2 at left hip had few doses of Fosamax, unable to tolerate.  HSV 1 diagnosed 05/2017.  Vaccines current. 2010  Negative colonoscopy.  Past medical history, past surgical history, family history and social history were all reviewed and documented in the EPIC chart.  Currently living with her daughter somewhat stressful situation.  ROS:  A ROS was performed and pertinent positives and negatives are included.  Exam:  Vitals:   02/14/18 1406  BP: 134/74  Weight: 126 lb 3.2 oz (57.2 kg)  Height: 5' 1.3" (1.557 m)   Body mass index is 23.61 kg/m.   General appearance:  Normal Thyroid:  Symmetrical, normal in size, without palpable masses or nodularity. Respiratory  Auscultation:  Clear without wheezing or rhonchi Cardiovascular  Auscultation:  Regular rate, without rubs, murmurs or gallops  Edema/varicosities:  Not grossly evident Abdominal  Soft,nontender, without masses, guarding or rebound.  Liver/spleen:  No organomegaly noted  Hernia:  None appreciated  Skin  Inspection:  Grossly normal   Breasts: Examined lying and sitting.     Right: Without masses, retractions, discharge or axillary adenopathy.     Left: Without masses, retractions, discharge or axillary adenopathy. Gentitourinary   Inguinal/mons:  Normal without inguinal adenopathy  External genitalia:  Normal  BUS/Urethra/Skene's glands:  Normal  Vagina: Mild atrophy  Cervix:  Normal  Uterus:  normal in size, shape and contour.  Midline and mobile  Adnexa/parametria:     Rt: Without masses or tenderness.   Lt: Without masses or tenderness.  Anus and perineum: Normal  Digital rectal exam: Normal sphincter tone without palpated masses or  tenderness  Assessment/Plan:  67 y.o. S WF G4, P4 for breast and pelvic exam.  Postmenopausal on no HRT with no bleeding HSV 1 Osteopenia, several doses of Fosamax not able to tolerate/no fractures Anxiety/depression  - primary care manages labs and meds  Plan: SBE's, exercise, calcium rich foods, vitamin D 2000 daily encouraged.  Reviewed importance of weightbearing and balance type exercise, yoga encouraged.  Home safety, fall prevention discussed.  Repeat DEXA will schedule.  Refill of Valtrex 500 take twice daily for 3 to 5 days as needed prescription, proper use given and reviewed.  Pap screening guidelines reviewed.  Reviewed importance of leisure activities, self-care.   Harrington Challengerancy J Danai Gotto [facility] CenterWHNP, 2:12 PM 02/14/2018

## 2018-04-06 ENCOUNTER — Encounter: Payer: Self-pay | Admitting: Gynecology

## 2018-04-06 ENCOUNTER — Other Ambulatory Visit: Payer: Self-pay | Admitting: Gynecology

## 2018-04-06 ENCOUNTER — Other Ambulatory Visit: Payer: Self-pay | Admitting: Women's Health

## 2018-04-06 ENCOUNTER — Ambulatory Visit (INDEPENDENT_AMBULATORY_CARE_PROVIDER_SITE_OTHER): Payer: Medicare Other

## 2018-04-06 ENCOUNTER — Telehealth: Payer: Self-pay | Admitting: Gynecology

## 2018-04-06 DIAGNOSIS — M810 Age-related osteoporosis without current pathological fracture: Secondary | ICD-10-CM

## 2018-04-06 DIAGNOSIS — Z13820 Encounter for screening for osteoporosis: Secondary | ICD-10-CM

## 2018-04-06 DIAGNOSIS — Z780 Asymptomatic menopausal state: Secondary | ICD-10-CM | POA: Diagnosis not present

## 2018-04-06 DIAGNOSIS — E2839 Other primary ovarian failure: Secondary | ICD-10-CM

## 2018-04-06 NOTE — Telephone Encounter (Signed)
Tell patient her bone density shows osteoporosis with a statistically significant loss of bone at all measured sites.  Recommend office visit to discuss treatment options.

## 2018-04-07 ENCOUNTER — Encounter: Payer: Self-pay | Admitting: *Deleted

## 2018-04-07 NOTE — Telephone Encounter (Signed)
Sent mychart message

## 2018-04-10 NOTE — Telephone Encounter (Signed)
Patient read my chart message

## 2018-04-12 ENCOUNTER — Ambulatory Visit: Payer: Medicare Other | Admitting: Gynecology

## 2018-04-12 ENCOUNTER — Encounter: Payer: Self-pay | Admitting: Gynecology

## 2018-04-12 VITALS — BP 118/78

## 2018-04-12 DIAGNOSIS — M810 Age-related osteoporosis without current pathological fracture: Secondary | ICD-10-CM | POA: Diagnosis not present

## 2018-04-12 NOTE — Progress Notes (Signed)
    Miranda Pierce 06-05-1950 [id number]        68 y.o.  [id number] presents with her daughter to discuss her most recent DEXA which shows osteoporosis T score -2.8 with statistically significant decline from her prior 2017 DEXA at all measured sites to include 3.4% at the spine, 11% and 13% at the hips.  Had transiently tried alendronate several years ago but had unacceptable side effects to include a lot of muscle aches and joint pain.  No overt risk factors.  No family history of osteoporosis.  Is active with no history of falls or fractures.  Past medical history,surgical history, problem list, medications, allergies, family history and social history were all reviewed and documented in the EPIC chart.  Directed ROS with pertinent positives and negatives documented in the history of present illness/assessment and plan.  Exam: Vitals:   04/12/18 1535  BP: 118/78   General appearance:  Normal   Assessment/Plan:  68 y.o. Miranda Pierce with osteoporosis and statistically significant loss at all measured sites.  We reviewed her bone density study in detail and I gave her a copy.  We discussed her significant increased risk of fracture and discussed the devastating sequela from fractures.  We discussed treatment options to help acutely decreased fracture risk, stop ongoing calcium loss and hopefully replace some bone.  Treatment options reviewed to include bisphosphate's, Prolia, Evista, Evintity and Forteo.  Side effects and risks of each were reviewed to include GERD, osteonecrosis of the jaw, atypical fractures, infections and rashes.  Given her total picture my recommendation would be to start on Prolia.  We discussed at this point that once starting Prolia we normally recommend continuing this or switching to a different antiresorptive but not stopping it altogether given the accelerated bone loss in most recent studies.  After lengthy discussion the patient wants to go ahead and initiate Prolia and we  will start this for her.  I discussed weightbearing exercise on a regular basis as well as adequate calcium and vitamin D intake.  I am going to check TSH, vitamin D level and PTH along with CMP.  I spent a total of 25 face-to-face minutes with the patient, over 50% was spent counseling and coordination of care.     Dara Lords MD, 4:34 PM 04/12/2018

## 2018-04-12 NOTE — Patient Instructions (Signed)
Office will call you to initiate the Prolia injections.

## 2018-04-13 ENCOUNTER — Other Ambulatory Visit: Payer: Self-pay

## 2018-04-13 ENCOUNTER — Encounter: Payer: Self-pay | Admitting: Gynecology

## 2018-04-13 LAB — COMPREHENSIVE METABOLIC PANEL
AG Ratio: 1.8 (calc) (ref 1.0–2.5)
ALT: 10 U/L (ref 6–29)
AST: 14 U/L (ref 10–35)
Albumin: 4.3 g/dL (ref 3.6–5.1)
Alkaline phosphatase (APISO): 64 U/L (ref 33–130)
BUN: 18 mg/dL (ref 7–25)
CO2: 23 mmol/L (ref 20–32)
Calcium: 9.5 mg/dL (ref 8.6–10.4)
Chloride: 104 mmol/L (ref 98–110)
Creat: 0.67 mg/dL (ref 0.50–0.99)
GLUCOSE: 84 mg/dL (ref 65–99)
Globulin: 2.4 g/dL (calc) (ref 1.9–3.7)
Potassium: 4.1 mmol/L (ref 3.5–5.3)
Sodium: 140 mmol/L (ref 135–146)
Total Bilirubin: 0.4 mg/dL (ref 0.2–1.2)
Total Protein: 6.7 g/dL (ref 6.1–8.1)

## 2018-04-13 LAB — VITAMIN D 25 HYDROXY (VIT D DEFICIENCY, FRACTURES): Vit D, 25-Hydroxy: 47 ng/mL (ref 30–100)

## 2018-04-13 LAB — TSH: TSH: 2.04 mIU/L (ref 0.40–4.50)

## 2018-04-13 NOTE — Telephone Encounter (Signed)
Yes

## 2018-04-13 NOTE — Telephone Encounter (Signed)
I think it is fine in your particular case.  You are agile and active and do not think it would be an issue

## 2018-04-14 ENCOUNTER — Encounter: Payer: Self-pay | Admitting: Gynecology

## 2018-04-14 LAB — PARATHYROID HORMONE, INTACT (NO CA): PTH: 33 pg/mL (ref 14–64)

## 2018-04-17 ENCOUNTER — Encounter: Payer: Self-pay | Admitting: *Deleted

## 2018-04-17 ENCOUNTER — Telehealth: Payer: Self-pay | Admitting: *Deleted

## 2018-04-17 NOTE — Telephone Encounter (Signed)
Deductible  Amount Met  OOP MAX $3300 ($20met)  Annual exam 02/14/18 [place]  Calcium 9.5          Date 04/12/2018  Upcoming dental procedures NO  Prior Authorization needed NO  Pt estimated Cost $85  appt 04/19/2018 at 2:45     Coverage Details: $50 one dose, $35 admin   Fontaine, Timothy P, MD sent to [place], [place], RMA        Start on Prolia. Diagnosis osteoporosis. Prior trial of alendronate with unacceptable side effects     

## 2018-04-17 NOTE — Telephone Encounter (Signed)
Question from patient  Do you recommend her to take calcium? If so what dose?  Pt will get her 1st dose of Prolia 04/19/2018

## 2018-04-17 NOTE — Telephone Encounter (Signed)
Her total dietary intake of calcium should be 1200 to 1500 mg daily.  She should try to estimate what she is getting in her diet and then add additional calcium if needed.  Usually 250 mg to 500 mg additional pill form is all that is needed daily.  She can google calcium content of food and it will tell her the calcium amount in servings of a variety of food.

## 2018-04-17 NOTE — Telephone Encounter (Signed)
Message sent on Mychart.

## 2018-04-19 ENCOUNTER — Ambulatory Visit (INDEPENDENT_AMBULATORY_CARE_PROVIDER_SITE_OTHER): Payer: Medicare Other

## 2018-04-19 DIAGNOSIS — M810 Age-related osteoporosis without current pathological fracture: Secondary | ICD-10-CM | POA: Diagnosis not present

## 2018-04-19 MED ORDER — DENOSUMAB 60 MG/ML ~~LOC~~ SOSY
60.0000 mg | PREFILLED_SYRINGE | Freq: Once | SUBCUTANEOUS | Status: AC
  Administered 2018-04-19: 60 mg via SUBCUTANEOUS

## 2018-05-02 NOTE — Telephone Encounter (Signed)
PROLIA GIVEN 04/19/2018 NEXT INJECTION 10/19/2018

## 2018-06-22 ENCOUNTER — Encounter: Payer: Self-pay | Admitting: Gynecology

## 2018-08-21 ENCOUNTER — Telehealth: Payer: Self-pay

## 2018-08-21 NOTE — Telephone Encounter (Signed)
Patient called wondering if she should schedule Prolia injection or was this something we would call her to do.    Note in chart:  May 02, 2018  Daryll Brod, [place]     PROLIA GIVEN 04/19/2018 NEXT INJECTION 10/19/2018     I will forward this to [facility] for her to call patient to arrange. I called patient back and let her know someone would be in touch at some point to schedule.

## 2018-09-28 NOTE — Telephone Encounter (Signed)
Deductible Amount Met  OOP MAX $3300 ($285mt)  [place] exam 02/14/18 [place]  Calcium   9.5          Date 04/12/2018  Upcoming dental procedures NO  Prior Authorization needed NO  Pt estimated Cost $85   10/20/2018 appt 2:30    Coverage Details:one dose $50,admin fee $35

## 2018-10-20 ENCOUNTER — Other Ambulatory Visit: Payer: Self-pay

## 2018-10-20 ENCOUNTER — Ambulatory Visit (INDEPENDENT_AMBULATORY_CARE_PROVIDER_SITE_OTHER): Payer: Medicare Other | Admitting: Anesthesiology

## 2018-10-20 DIAGNOSIS — M810 Age-related osteoporosis without current pathological fracture: Secondary | ICD-10-CM

## 2018-10-20 MED ORDER — DENOSUMAB 60 MG/ML ~~LOC~~ SOSY
60.0000 mg | PREFILLED_SYRINGE | Freq: Once | SUBCUTANEOUS | Status: AC
  Administered 2018-10-20: 60 mg via SUBCUTANEOUS

## 2018-12-27 ENCOUNTER — Encounter: Payer: Self-pay | Admitting: Gynecology

## 2019-01-05 NOTE — Telephone Encounter (Signed)
PROLIA GIVEN 10/20/2018 NEXT INJECTION 04/23/2019

## 2019-02-19 ENCOUNTER — Encounter: Payer: Medicare Other | Admitting: Women's Health

## 2019-02-23 ENCOUNTER — Other Ambulatory Visit: Payer: Self-pay

## 2019-02-26 ENCOUNTER — Other Ambulatory Visit: Payer: Self-pay

## 2019-02-26 ENCOUNTER — Encounter: Payer: Self-pay | Admitting: Women's Health

## 2019-02-26 ENCOUNTER — Ambulatory Visit (INDEPENDENT_AMBULATORY_CARE_PROVIDER_SITE_OTHER): Payer: Medicare Other | Admitting: Women's Health

## 2019-02-26 VITALS — BP 140/80 | Ht 61.0 in | Wt 129.0 lb

## 2019-02-26 DIAGNOSIS — Z01419 Encounter for gynecological examination (general) (routine) without abnormal findings: Secondary | ICD-10-CM | POA: Diagnosis not present

## 2019-02-26 NOTE — Patient Instructions (Addendum)
Health Maintenance After Age 70 After age 68, you are at a higher risk for certain long-term diseases and infections as well as injuries from falls. Falls are a major cause of broken bones and head injuries in people who are older than age 20. Getting regular preventive care can help to keep you healthy and well. Preventive care includes getting regular testing and making lifestyle changes as recommended by your health care provider. Talk with your health care provider about:  Which screenings and tests you should have. A screening is a test that checks for a disease when you have no symptoms.  A diet and exercise plan that is right for you. What should I know about screenings and tests to prevent falls? Screening and testing are the best ways to find a health problem early. Early diagnosis and treatment give you the best chance of managing medical conditions that are common after age 61. Certain conditions and lifestyle choices may make you more likely to have a fall. Your health care provider may recommend:  Regular vision checks. Poor vision and conditions such as cataracts can make you more likely to have a fall. If you wear glasses, make sure to get your prescription updated if your vision changes.  Medicine review. Work with your health care provider to regularly review all of the medicines you are taking, including over-the-counter medicines. Ask your health care provider about any side effects that may make you more likely to have a fall. Tell your health care provider if any medicines that you take make you feel dizzy or sleepy.  Osteoporosis screening. Osteoporosis is a condition that causes the bones to get weaker. This can make the bones weak and cause them to break more easily.  Blood pressure screening. Blood pressure changes and medicines to control blood pressure can make you feel dizzy.  Strength and balance checks. Your health care provider may recommend certain tests to check  your strength and balance while standing, walking, or changing positions.  Foot health exam. Foot pain and numbness, as well as not wearing proper footwear, can make you more likely to have a fall.  Depression screening. You may be more likely to have a fall if you have a fear of falling, feel emotionally low, or feel unable to do activities that you used to do.  Alcohol use screening. Using too much alcohol can affect your balance and may make you more likely to have a fall. What actions can I take to lower my risk of falls? General instructions  Talk with your health care provider about your risks for falling. Tell your health care provider if: ? You fall. Be sure to tell your health care provider about all falls, even ones that seem minor. ? You feel dizzy, sleepy, or off-balance.  Take over-the-counter and prescription medicines only as told by your health care provider. These include any supplements.  Eat a healthy diet and maintain a healthy weight. A healthy diet includes low-fat dairy products, low-fat (lean) meats, and fiber from whole grains, beans, and lots of fruits and vegetables. Home safety  Remove any tripping hazards, such as rugs, cords, and clutter.  Install safety equipment such as grab bars in bathrooms and safety rails on stairs.  Keep rooms and walkways well-lit. Activity   Follow a regular exercise program to stay fit. This will help you maintain your balance. Ask your health care provider what types of exercise are appropriate for you.  If you need a cane or  walker, use it as recommended by your health care provider.  Wear supportive shoes that have nonskid soles. Lifestyle  Do not drink alcohol if your health care provider tells you not to drink.  If you drink alcohol, limit how much you have: ? 0-1 drink a day for women. ? 0-2 drinks a day for men.  Be aware of how much alcohol is in your drink. In the [place] one drink equals one typical bottle of  beer (12 oz), one-half glass of wine (5 oz), or one shot of hard liquor (1 oz).  Do not use any products that contain nicotine or tobacco, such as cigarettes and e-cigarettes. If you need help quitting, ask your health care provider. Summary  Having a healthy lifestyle and getting preventive care can help to protect your health and wellness after age 51.  Screening and testing are the best way to find a health problem early and help you avoid having a fall. Early diagnosis and treatment give you the best chance for managing medical conditions that are more common for people who are older than age 39.  Falls are a major cause of broken bones and head injuries in people who are older than age 20. Take precautions to prevent a fall at home.  Work with your health care provider to learn what changes you can make to improve your health and wellness and to prevent falls. This information is not intended to replace advice given to you by your health care provider. Make sure you discuss any questions you have with your health care provider. Document Released: 01/19/2017 Document Revised: 06/29/2018 Document Reviewed: 01/19/2017 Elsevier Patient Education  [place]. Denosumab injection What is this medicine? DENOSUMAB (den oh sue mab) slows bone breakdown. Prolia is used to treat osteoporosis in women after menopause and in men, and in people who are taking corticosteroids for 6 months or more. Delton See is used to treat a high calcium level due to cancer and to prevent bone fractures and other bone problems caused by multiple myeloma or cancer bone metastases. Delton See is also used to treat giant cell tumor of the bone. This medicine may be used for other purposes; ask your health care provider or pharmacist if you have questions. COMMON BRAND NAME(S): Prolia, XGEVA What should I tell my health care provider before I take this medicine? They need to know if you have any of these conditions:   dental disease  having surgery or tooth extraction  infection  kidney disease  low levels of calcium or Vitamin D in the blood  malnutrition  on hemodialysis  skin conditions or sensitivity  thyroid or parathyroid disease  an unusual reaction to denosumab, other medicines, foods, dyes, or preservatives  pregnant or trying to get pregnant  breast-feeding How should I use this medicine? This medicine is for injection under the skin. It is given by a health care professional in a hospital or clinic setting. A special MedGuide will be given to you before each treatment. Be sure to read this information carefully each time. For Prolia, talk to your pediatrician regarding the use of this medicine in children. Special care may be needed. For Delton See, talk to your pediatrician regarding the use of this medicine in children. While this drug may be prescribed for children as Dalisha Shively as 13 years for selected conditions, precautions do apply. Overdosage: If you think you have taken too much of this medicine contact a poison control center or emergency room at once. NOTE:  This medicine is only for you. Do not share this medicine with others. What if I miss a dose? It is important not to miss your dose. Call your doctor or health care professional if you are unable to keep an appointment. What may interact with this medicine? Do not take this medicine with any of the following medications:  other medicines containing denosumab This medicine may also interact with the following medications:  medicines that lower your chance of fighting infection  steroid medicines like prednisone or cortisone This list may not describe all possible interactions. Give your health care provider a list of all the medicines, herbs, non-prescription drugs, or dietary supplements you use. Also tell them if you smoke, drink alcohol, or use illegal drugs. Some items may interact with your medicine. What should I watch  for while using this medicine? Visit your doctor or health care professional for regular checks on your progress. Your doctor or health care professional may order blood tests and other tests to see how you are doing. Call your doctor or health care professional for advice if you get a fever, chills or sore throat, or other symptoms of a cold or flu. Do not treat yourself. This drug may decrease your body's ability to fight infection. Try to avoid being around people who are sick. You should make sure you get enough calcium and vitamin D while you are taking this medicine, unless your doctor tells you not to. Discuss the foods you eat and the vitamins you take with your health care professional. See your dentist regularly. Brush and floss your teeth as directed. Before you have any dental work done, tell your dentist you are receiving this medicine. Do not become pregnant while taking this medicine or for 5 months after stopping it. Talk with your doctor or health care professional about your birth control options while taking this medicine. Women should inform their doctor if they wish to become pregnant or think they might be pregnant. There is a potential for serious side effects to an unborn child. Talk to your health care professional or pharmacist for more information. What side effects may I notice from receiving this medicine? Side effects that you should report to your doctor or health care professional as soon as possible:  allergic reactions like skin rash, itching or hives, swelling of the face, lips, or tongue  bone pain  breathing problems  dizziness  jaw pain, especially after dental work  redness, blistering, peeling of the skin  signs and symptoms of infection like fever or chills; cough; sore throat; pain or trouble passing urine  signs of low calcium like fast heartbeat, muscle cramps or muscle pain; pain, tingling, numbness in the hands or feet; seizures  unusual bleeding  or bruising  unusually weak or tired Side effects that usually do not require medical attention (report to your doctor or health care professional if they continue or are bothersome):  constipation  diarrhea  headache  joint pain  loss of appetite  muscle pain  runny nose  tiredness  upset stomach This list may not describe all possible side effects. Call your doctor for medical advice about side effects. You may report side effects to FDA at 1-800-FDA-1088. Where should I keep my medicine? This medicine is only given in a clinic, doctor's office, or other health care setting and will not be stored at home. NOTE: This sheet is a summary. It may not cover all possible information. If you have questions about this  medicine, talk to your doctor, pharmacist, or health care provider.  2020 Elsevier/Gold Standard (2017-07-15 16:10:44)

## 2019-02-26 NOTE — Progress Notes (Signed)
Miranda Pierce Jun 17, 1970 [id number]    History:    Presents for breast and pelvic exam with no complaints.  Postmenopausal on no HRT with no bleeding.  Not sexually active.  03/2018 T score -2.8 has had 2 doses of Prolia tolerating well.  Next dose due February 2021.  2019 negative colonoscopy.  Current on vaccines has had Shingrix.  Abnormal Pap greater than 20 years ago normal Paps since normal mammogram history.  Primary care manages hypothyroidism.  Has a counselor and psychiatrist for anxiety/depression.  Past medical history, past surgical history, family history and social history were all reviewed and documented in the EPIC chart.  4 children.  Currently living with a friend which she states is working out well.  ROS:  A ROS was performed and pertinent positives and negatives are included.  Exam:  Vitals:   02/26/19 1148  BP: 140/80  Weight: 129 lb (58.5 kg)  Height: 5\' 1"  (1.549 m)   Body mass index is 24.37 kg/m.   General appearance:  Normal Thyroid:  Symmetrical, normal in size, without palpable masses or nodularity. Respiratory  Auscultation:  Clear without wheezing or rhonchi Cardiovascular  Auscultation:  Regular rate, without rubs, murmurs or gallops  Edema/varicosities:  Not grossly evident Abdominal  Soft,nontender, without masses, guarding or rebound.  Liver/spleen:  No organomegaly noted  Hernia:  None appreciated  Skin  Inspection:  Grossly normal   Breasts: Examined lying and sitting.     Right: Without masses, retractions, discharge or axillary adenopathy.     Left: Without masses, retractions, discharge or axillary adenopathy. Gentitourinary   Inguinal/mons:  Normal without inguinal adenopathy  External genitalia:  Normal  BUS/Urethra/Skene's glands:  Normal  Vagina:  Normal  Cervix:  Normal  Uterus:  normal in size, shape and contour.  Midline and mobile  Adnexa/parametria:     Rt: Without masses or tenderness.   Lt: Without masses or  tenderness.  Anus and perineum: Normal  Digital rectal exam: Normal sphincter tone without palpated masses or tenderness  Assessment/Plan:  68 y.o. D WF G4, P4 for breast and pelvic exam with no complaints.  Postmenopausal/no HRT/no bleeding/not sexually active Anxiety/depression counseling and primary care manages meds and labs. HSV-1 rare outbreaks Osteoporosis on Prolia, first dose 03/2018  Plan: Denies need for refill of Valtrex.  SBEs, reviewed importance of annual screening, will schedule.  Encouraged regular exercise, home safety, fall prevention and importance of weightbearing and balance type exercise, yoga encouraged.  Prolia, instructed to have labs faxed to our office for recent calcium level.  Prolia due in February, tolerating well will continue.  Pap screening guidelines reviewed.      Huel Cote [facility], 12:27 PM 02/26/2019

## 2019-02-26 NOTE — Assessment & Plan Note (Signed)
T score -2.8 Prolia started 03/2018

## 2019-04-10 ENCOUNTER — Telehealth: Payer: Self-pay | Admitting: *Deleted

## 2019-04-10 DIAGNOSIS — M810 Age-related osteoporosis without current pathological fracture: Secondary | ICD-10-CM

## 2019-04-10 NOTE — Telephone Encounter (Signed)
Pt has changed insurance to [place] [employer]  ID [id number] PROVIDER # [phone]   Prolia insurance verification has been sent awaiting Summary of benefits

## 2019-04-11 ENCOUNTER — Ambulatory Visit: Payer: Medicare Other

## 2019-04-12 ENCOUNTER — Ambulatory Visit: Payer: Medicare PPO | Attending: Internal Medicine

## 2019-04-12 DIAGNOSIS — Z23 Encounter for immunization: Secondary | ICD-10-CM | POA: Insufficient documentation

## 2019-04-12 NOTE — Progress Notes (Signed)
   Covid-19 Vaccination Clinic  Name:  Miranda Pierce    MRN: [id number] DOB: March 21, 1951  04/12/2019  Miranda Pierce was observed post Covid-19 immunization for 15 minutes without incidence. She was provided with Vaccine Information Sheet and instruction to access the V-Safe system.   Miranda Pierce was instructed to call 911 with any severe reactions post vaccine: Marland Kitchen Difficulty breathing  . Swelling of your face and throat  . A fast heartbeat  . A bad rash all over your body  . Dizziness and weakness    Immunizations Administered    Name Date Dose VIS Date Route   Pfizer COVID-19 Vaccine 04/12/2019  8:33 AM 0.3 mL 03/02/2019 Intramuscular   Manufacturer: [employer], [employer]   Lot: [id number]   NDC: [address]-6

## 2019-04-23 NOTE — Telephone Encounter (Addendum)
Deductible N/A  OOP MAX $3300 ( )  Annual exam 02/26/2019 [place]  Calcium 10.1           Date 05/16/2019  Upcoming dental procedures   Prior Authorization needed YES PROCESSING REFERENCE # [id number] approved  Pt estimated Cost $50  PROLIA APPT 05/25/2019     Coverage Details: $50 one dose, 0% admin fee

## 2019-05-01 ENCOUNTER — Ambulatory Visit: Payer: Medicare PPO | Attending: Internal Medicine

## 2019-05-01 DIAGNOSIS — Z23 Encounter for immunization: Secondary | ICD-10-CM | POA: Insufficient documentation

## 2019-05-01 NOTE — Progress Notes (Signed)
   Covid-19 Vaccination Clinic  Name:  ARDRA KUZNICKI    MRN: [id number] DOB: 21-May-1950  05/01/2019  Ms. Boulay was observed post Covid-19 immunization for 15 minutes without incidence. She was provided with Vaccine Information Sheet and instruction to access the V-Safe system.   Ms. Pangilinan was instructed to call 911 with any severe reactions post vaccine: Marland Kitchen Difficulty breathing  . Swelling of your face and throat  . A fast heartbeat  . A bad rash all over your body  . Dizziness and weakness    Immunizations Administered    Name Date Dose VIS Date Route   Pfizer COVID-19 Vaccine 05/01/2019 10:45 AM 0.3 mL 03/02/2019 Intramuscular   Manufacturer: [employer], [employer]   Lot: [id number]   NDC: [address]-7

## 2019-05-14 ENCOUNTER — Ambulatory Visit: Payer: Medicare Other

## 2019-05-16 ENCOUNTER — Other Ambulatory Visit: Payer: Self-pay

## 2019-05-16 ENCOUNTER — Other Ambulatory Visit: Payer: Medicare PPO

## 2019-05-16 DIAGNOSIS — M810 Age-related osteoporosis without current pathological fracture: Secondary | ICD-10-CM

## 2019-05-16 LAB — CALCIUM: Calcium: 10.1 mg/dL (ref 8.6–10.4)

## 2019-05-25 ENCOUNTER — Ambulatory Visit (INDEPENDENT_AMBULATORY_CARE_PROVIDER_SITE_OTHER): Payer: Medicare PPO | Admitting: Anesthesiology

## 2019-05-25 ENCOUNTER — Other Ambulatory Visit: Payer: Self-pay

## 2019-05-25 DIAGNOSIS — M810 Age-related osteoporosis without current pathological fracture: Secondary | ICD-10-CM

## 2019-05-25 MED ORDER — DENOSUMAB 60 MG/ML ~~LOC~~ SOSY
60.0000 mg | PREFILLED_SYRINGE | Freq: Once | SUBCUTANEOUS | Status: AC
  Administered 2019-05-25: 60 mg via SUBCUTANEOUS

## 2019-08-17 ENCOUNTER — Encounter: Payer: Self-pay | Admitting: Nurse Practitioner

## 2019-08-22 DIAGNOSIS — F4321 Adjustment disorder with depressed mood: Secondary | ICD-10-CM | POA: Diagnosis not present

## 2019-08-29 DIAGNOSIS — H9042 Sensorineural hearing loss, unilateral, left ear, with unrestricted hearing on the contralateral side: Secondary | ICD-10-CM | POA: Diagnosis not present

## 2019-08-29 DIAGNOSIS — I779 Disorder of arteries and arterioles, unspecified: Secondary | ICD-10-CM | POA: Diagnosis not present

## 2019-09-05 NOTE — Telephone Encounter (Signed)
PROLIA GIVEN 05/25/2019 NEXT INJECTION 11/26/2019

## 2019-09-07 DIAGNOSIS — H9042 Sensorineural hearing loss, unilateral, left ear, with unrestricted hearing on the contralateral side: Secondary | ICD-10-CM | POA: Diagnosis not present

## 2019-09-26 DIAGNOSIS — F4321 Adjustment disorder with depressed mood: Secondary | ICD-10-CM | POA: Diagnosis not present

## 2019-10-10 DIAGNOSIS — F4321 Adjustment disorder with depressed mood: Secondary | ICD-10-CM | POA: Diagnosis not present

## 2019-10-29 DIAGNOSIS — H60592 Other noninfective acute otitis externa, left ear: Secondary | ICD-10-CM | POA: Diagnosis not present

## 2019-10-31 DIAGNOSIS — F4321 Adjustment disorder with depressed mood: Secondary | ICD-10-CM | POA: Diagnosis not present

## 2019-11-01 DIAGNOSIS — H60392 Other infective otitis externa, left ear: Secondary | ICD-10-CM | POA: Diagnosis not present

## 2019-11-13 ENCOUNTER — Telehealth: Payer: Self-pay | Admitting: *Deleted

## 2019-11-13 NOTE — Telephone Encounter (Signed)
Deductible NO  OOP MAX NO  [place] exam 02/26/2019 [place]  Calcium  10.1           Date 05/16/2019  Upcoming dental procedures NO  Prior Authorization needed YES ON FILE UNTIL 03/21/2020  Pt estimated Cost $35   Appt 12/04/2019    Coverage Details: $O ONE DOSE, $35 ADMIN FEE

## 2019-11-21 DIAGNOSIS — F4321 Adjustment disorder with depressed mood: Secondary | ICD-10-CM | POA: Diagnosis not present

## 2019-11-29 ENCOUNTER — Other Ambulatory Visit: Payer: Self-pay | Admitting: Nurse Practitioner

## 2019-11-29 DIAGNOSIS — Z1231 Encounter for screening mammogram for malignant neoplasm of breast: Secondary | ICD-10-CM

## 2019-12-04 ENCOUNTER — Ambulatory Visit: Payer: Medicare PPO

## 2019-12-04 DIAGNOSIS — F332 Major depressive disorder, recurrent severe without psychotic features: Secondary | ICD-10-CM | POA: Diagnosis not present

## 2019-12-05 DIAGNOSIS — F4321 Adjustment disorder with depressed mood: Secondary | ICD-10-CM | POA: Diagnosis not present

## 2019-12-07 ENCOUNTER — Other Ambulatory Visit: Payer: Self-pay

## 2019-12-07 ENCOUNTER — Ambulatory Visit (INDEPENDENT_AMBULATORY_CARE_PROVIDER_SITE_OTHER): Payer: Medicare PPO | Admitting: Anesthesiology

## 2019-12-07 DIAGNOSIS — M810 Age-related osteoporosis without current pathological fracture: Secondary | ICD-10-CM

## 2019-12-07 DIAGNOSIS — Z23 Encounter for immunization: Secondary | ICD-10-CM

## 2019-12-07 MED ORDER — DENOSUMAB 60 MG/ML ~~LOC~~ SOSY
60.0000 mg | PREFILLED_SYRINGE | Freq: Once | SUBCUTANEOUS | Status: AC
  Administered 2019-12-07: 60 mg via SUBCUTANEOUS

## 2019-12-12 ENCOUNTER — Other Ambulatory Visit: Payer: Self-pay

## 2019-12-12 ENCOUNTER — Ambulatory Visit
Admission: RE | Admit: 2019-12-12 | Discharge: 2019-12-12 | Disposition: A | Discharge status: Home / Self Care | Payer: Medicare PPO | Source: Ambulatory Visit | Attending: Nurse Practitioner | Admitting: Nurse Practitioner

## 2019-12-12 DIAGNOSIS — Z1231 Encounter for screening mammogram for malignant neoplasm of breast: Secondary | ICD-10-CM

## 2019-12-19 DIAGNOSIS — F4321 Adjustment disorder with depressed mood: Secondary | ICD-10-CM | POA: Diagnosis not present

## 2020-01-09 ENCOUNTER — Encounter: Payer: Self-pay | Admitting: Nurse Practitioner

## 2020-01-09 ENCOUNTER — Ambulatory Visit: Payer: Medicare PPO | Admitting: Nurse Practitioner

## 2020-01-09 ENCOUNTER — Other Ambulatory Visit: Payer: Self-pay

## 2020-01-09 VITALS — BP 126/80

## 2020-01-09 DIAGNOSIS — L900 Lichen sclerosus et atrophicus: Secondary | ICD-10-CM | POA: Diagnosis not present

## 2020-01-09 DIAGNOSIS — L292 Pruritus vulvae: Secondary | ICD-10-CM | POA: Diagnosis not present

## 2020-01-09 LAB — WET PREP FOR TRICH, YEAST, CLUE

## 2020-01-09 MED ORDER — CLOBETASOL PROPIONATE 0.05 % EX OINT: 1.0000 "application " | TOPICAL_OINTMENT | Freq: Two times a day (BID) | CUTANEOUS | 1 refills | Status: DC

## 2020-01-09 NOTE — Patient Instructions (Signed)
Lichen Sclerosus Lichen sclerosus is a skin problem. It can happen on any part of the body, but it commonly involves the anal or genital areas. It can cause itching and discomfort in these areas. Treatment can help to control symptoms. When the genital area is affected, getting treatment is important because the condition can cause scarring that may lead to other problems. What are the causes? The cause of this condition is not known. It may be related to an overactive immune system or a lack of certain hormones. Lichen sclerosus is not an infection or a fungus, and it is not passed from one person to another (not contagious). What increases the risk? This condition is more likely to develop in women, usually after menopause. What are the signs or symptoms? Symptoms of this condition include:  Thin, wrinkled, white areas on the skin.  Thickened white areas on the skin.  Red and swollen patches (lesions) on the skin.  Tears or cracks in the skin.  Bruising.  Blood blisters.  Severe itching.  Pain, itching, or burning when urinating. Constipation is also common in people with lichen sclerosus. How is this diagnosed? This condition may be diagnosed with a physical exam. In some cases, a tissue sample (biopsy sample) may be removed to be looked at under a microscope. How is this treated? This condition is usually treated with medicated creams or ointments (topical steroids) that are applied over the affected areas. In some cases, treatment may also include medicines that are taken by mouth. Surgery may be needed in more severe cases that are causing problems such as scarring. Follow these instructions at home:  Take or use over-the-counter and prescription medicines only as told by your health care provider.  Use creams or ointments as told by your health care provider.  Do not scratch the affected areas of skin.  If you are a woman, be sure to keep the vaginal area as clean and dry  as possible.  Clean the affected area of skin gently with water. Avoid using rough towels or toilet paper.  Keep all follow-up visits as told by your health care provider. This is important. Contact a health care provider if:  You have increasing redness, swelling, or pain in the affected area.  You have fluid, blood, or pus coming from the affected area.  You have new lesions on your skin.  You have a fever.  You have pain during sex. Summary  Lichen sclerosus is a skin problem. When the genital area is affected, getting treatment is important because the condition can cause scarring that may lead to other problems.  This condition is usually treated with medicated creams or ointments (topical steroids) that are applied over the affected areas.  Take or use over-the-counter and prescription medicines only as told by your health care provider.  Contact a health care provider if you have new lesions on your skin, have pain during sex, or have increasing redness, swelling, or pain in the affected area.  Keep all follow-up visits as told by your health care provider. This is important. This information is not intended to replace advice given to you by your health care provider. Make sure you discuss any questions you have with your health care provider. Document Revised: 07/21/2017 Document Reviewed: 07/21/2017 Elsevier Patient Education  2020 Elsevier Inc.  

## 2020-01-09 NOTE — Progress Notes (Signed)
   Acute Office Visit  Subjective:    Patient ID: Miranda Pierce, female    DOB: 1950-04-02, 69 y.o.   MRN: [id number]   HPI 69 y.o. presents today for vaginal irritation and itching that started a few months ago. The itching is external and is started to extend into her anal region. History of lichen sclerosis. Denies vaginal discharge.    Review of Systems  Constitutional: Negative.   Genitourinary: Positive for vaginal pain.       Vaginal itching       Objective:    Physical Exam Constitutional:      Appearance: Normal appearance.  Genitourinary:    Comments: Pearly-white, thin appearance of vulva and perineum consistent with LC. Some excoriation from scratching    BP 126/80 (BP Location: Right Arm, Patient Position: Sitting, Cuff Size: Normal)  Wt Readings from Last 3 Encounters:  02/26/19 129 lb (58.5 kg)  02/14/18 126 lb 3.2 oz (57.2 kg)  11/30/16 126 lb (57.2 kg)        Assessment & Plan:   Problem List Items Addressed This Visit      Other   Lichen sclerosus - Primary   Relevant Medications   clobetasol ointment (TEMOVATE) 0.05 %    Other Visit Diagnoses    Vulvar itching       Relevant Orders   WET PREP FOR TRICH, YEAST, CLUE     Plan: Exam and symptoms consistent with lichen sclerosis. Clobetasol 0.05% ointment applied externally twice a day for 4 weeks, then every other day for 4 weeks, and then twice a day for 4 weeks and as needed. Wet prep unremarkable. She will return to clinic if symptoms do not improve. She is agreeable to plan.      [facility], 3:00 PM 01/09/2020

## 2020-01-15 DIAGNOSIS — F4321 Adjustment disorder with depressed mood: Secondary | ICD-10-CM | POA: Diagnosis not present

## 2020-01-29 DIAGNOSIS — F4321 Adjustment disorder with depressed mood: Secondary | ICD-10-CM | POA: Diagnosis not present

## 2020-02-20 DIAGNOSIS — F4321 Adjustment disorder with depressed mood: Secondary | ICD-10-CM | POA: Diagnosis not present

## 2020-03-12 ENCOUNTER — Other Ambulatory Visit: Payer: Self-pay

## 2020-03-12 ENCOUNTER — Encounter: Payer: Self-pay | Admitting: Nurse Practitioner

## 2020-03-12 ENCOUNTER — Ambulatory Visit: Payer: Medicare PPO | Admitting: Nurse Practitioner

## 2020-03-12 VITALS — BP 122/80 | Ht 61.0 in | Wt 123.0 lb

## 2020-03-12 DIAGNOSIS — L900 Lichen sclerosus et atrophicus: Secondary | ICD-10-CM | POA: Diagnosis not present

## 2020-03-12 DIAGNOSIS — M810 Age-related osteoporosis without current pathological fracture: Secondary | ICD-10-CM

## 2020-03-12 DIAGNOSIS — Z01419 Encounter for gynecological examination (general) (routine) without abnormal findings: Secondary | ICD-10-CM | POA: Diagnosis not present

## 2020-03-12 NOTE — Patient Instructions (Addendum)
Replens 2-3 times per week  Health Maintenance After Age 71 After age 55, you are at a higher risk for certain long-term diseases and infections as well as injuries from falls. Falls are a major cause of broken bones and head injuries in people who are older than age 24. Getting regular preventive care can help to keep you healthy and well. Preventive care includes getting regular testing and making lifestyle changes as recommended by your health care provider. Talk with your health care provider about:  Which screenings and tests you should have. A screening is a test that checks for a disease when you have no symptoms.  A diet and exercise plan that is right for you. What should I know about screenings and tests to prevent falls? Screening and testing are the best ways to find a health problem early. Early diagnosis and treatment give you the best chance of managing medical conditions that are common after age 42. Certain conditions and lifestyle choices may make you more likely to have a fall. Your health care provider may recommend:  Regular vision checks. Poor vision and conditions such as cataracts can make you more likely to have a fall. If you wear glasses, make sure to get your prescription updated if your vision changes.  Medicine review. Work with your health care provider to regularly review all of the medicines you are taking, including over-the-counter medicines. Ask your health care provider about any side effects that may make you more likely to have a fall. Tell your health care provider if any medicines that you take make you feel dizzy or sleepy.  Osteoporosis screening. Osteoporosis is a condition that causes the bones to get weaker. This can make the bones weak and cause them to break more easily.  Blood pressure screening. Blood pressure changes and medicines to control blood pressure can make you feel dizzy.  Strength and balance checks. Your health care provider may  recommend certain tests to check your strength and balance while standing, walking, or changing positions.  Foot health exam. Foot pain and numbness, as well as not wearing proper footwear, can make you more likely to have a fall.  Depression screening. You may be more likely to have a fall if you have a fear of falling, feel emotionally low, or feel unable to do activities that you used to do.  Alcohol use screening. Using too much alcohol can affect your balance and may make you more likely to have a fall. What actions can I take to lower my risk of falls? General instructions  Talk with your health care provider about your risks for falling. Tell your health care provider if: ? You fall. Be sure to tell your health care provider about all falls, even ones that seem minor. ? You feel dizzy, sleepy, or off-balance.  Take over-the-counter and prescription medicines only as told by your health care provider. These include any supplements.  Eat a healthy diet and maintain a healthy weight. A healthy diet includes low-fat dairy products, low-fat (lean) meats, and fiber from whole grains, beans, and lots of fruits and vegetables. Home safety  Remove any tripping hazards, such as rugs, cords, and clutter.  Install safety equipment such as grab bars in bathrooms and safety rails on stairs.  Keep rooms and walkways well-lit. Activity   Follow a regular exercise program to stay fit. This will help you maintain your balance. Ask your health care provider what types of exercise are appropriate for you.  If you need a cane or walker, use it as recommended by your health care provider.  Wear supportive shoes that have nonskid soles. Lifestyle  Do not drink alcohol if your health care provider tells you not to drink.  If you drink alcohol, limit how much you have: ? 0-1 drink a day for women. ? 0-2 drinks a day for men.  Be aware of how much alcohol is in your drink. In the [place] one drink  equals one typical bottle of beer (12 oz), one-half glass of wine (5 oz), or one shot of hard liquor (1 oz).  Do not use any products that contain nicotine or tobacco, such as cigarettes and e-cigarettes. If you need help quitting, ask your health care provider. Summary  Having a healthy lifestyle and getting preventive care can help to protect your health and wellness after age 37.  Screening and testing are the best way to find a health problem early and help you avoid having a fall. Early diagnosis and treatment give you the best chance for managing medical conditions that are more common for people who are older than age 87.  Falls are a major cause of broken bones and head injuries in people who are older than age 71. Take precautions to prevent a fall at home.  Work with your health care provider to learn what changes you can make to improve your health and wellness and to prevent falls. This information is not intended to replace advice given to you by your health care provider. Make sure you discuss any questions you have with your health care provider. Document Revised: 06/29/2018 Document Reviewed: 01/19/2017 Elsevier Patient Education  2020 [employer].

## 2020-03-12 NOTE — Progress Notes (Signed)
   Miranda Pierce 02-04-1951 [id number]   History:  69 y.o. G4P4004 presents for annual exam.Postmenopausal - no HRT, no bleeding. Normal pap and mammogram history. Osteoporosis, on Prolia. History of LC, hypothyroidism.   Gynecologic History No LMP recorded. Patient is postmenopausal.   Contraception: post menopausal status Last Pap: no longer screening per guidelines Last mammogram: 12/12/2019. Results were: normal Last colonoscopy: 2019. Results were: normal Last Dexa: 04/06/2018. Results were: t-score -2.8  Past medical history, past surgical history, family history and social history were all reviewed and documented in the EPIC chart.  ROS:  A ROS was performed and pertinent positives and negatives are included.  Exam:  Vitals:   03/12/20 1432  BP: 122/80  Weight: 123 lb (55.8 kg)  Height: 5\' 1"  (1.549 m)   Body mass index is 23.24 kg/m.  General appearance:  Normal Thyroid:  Symmetrical, normal in size, without palpable masses or nodularity. Respiratory  Auscultation:  Clear without wheezing or rhonchi Cardiovascular  Auscultation:  Regular rate, without rubs, murmurs or gallops  Edema/varicosities:  Not grossly evident Abdominal  Soft,nontender, without masses, guarding or rebound.  Liver/spleen:  No organomegaly noted  Hernia:  None appreciated  Skin  Inspection:  Grossly normal   Breasts: Examined lying and sitting.   Right: Without masses, retractions, discharge or axillary adenopathy.   Left: Without masses, retractions, discharge or axillary adenopathy. Gentitourinary   Inguinal/mons:  Normal without inguinal adenopathy  External genitalia:  Normal  BUS/Urethra/Skene's glands:  Normal  Vagina:  Atrophic changes  Cervix:  Normal  Uterus:  Anteverted, normal in size, shape and contour.  Midline and mobile  Adnexa/parametria:     Rt: Without masses or tenderness.   Lt: Without masses or tenderness.  Anus and perineum: Normal, non-bleeding  hemorrhoid  Digital rectal exam: Normal sphincter tone without palpated masses or tenderness  Assessment/Plan:  69 y.o. 64 for annual exam.   Well female exam with routine gynecological exam - Education provided on SBEs, importance of preventative screenings, current guidelines, high calcium diet, regular exercise, and multivitamin daily. Labs with PCP.   Age-related osteoporosis without current pathological fracture - Plan: DG Bone Density. 03/2019 t-score -2.8. Prolia SQ every 6 months, most recent 11/2019. Tolerating well.   Lichen sclerosus - Good management on Clobetasol ointment  Screening for cervical cancer - Normal Pap history. No longer screening per guidelines.   Screening for breast cancer - Normal mammogram history.  Continue annual screenings.  Normal breast exam today.  Screening for colon cancer - 2019 colonoscopy. Will repeat at GI's recommended interval.   Follow up in 1 year for annual.  2020 [facility], 2:40 PM 03/12/2020

## 2020-03-18 DIAGNOSIS — F4321 Adjustment disorder with depressed mood: Secondary | ICD-10-CM | POA: Diagnosis not present

## 2020-03-19 DIAGNOSIS — M5451 Vertebrogenic low back pain: Secondary | ICD-10-CM | POA: Diagnosis not present

## 2020-04-10 DIAGNOSIS — F4321 Adjustment disorder with depressed mood: Secondary | ICD-10-CM | POA: Diagnosis not present

## 2020-05-01 DIAGNOSIS — F4321 Adjustment disorder with depressed mood: Secondary | ICD-10-CM | POA: Diagnosis not present

## 2020-05-07 ENCOUNTER — Other Ambulatory Visit: Payer: Self-pay | Admitting: Nurse Practitioner

## 2020-05-07 ENCOUNTER — Encounter: Payer: Self-pay | Admitting: Nurse Practitioner

## 2020-05-07 DIAGNOSIS — B009 Herpesviral infection, unspecified: Secondary | ICD-10-CM

## 2020-05-07 MED ORDER — VALACYCLOVIR HCL 500 MG PO TABS: 500.0000 mg | ORAL_TABLET | Freq: Two times a day (BID) | ORAL | 2 refills | Status: DC

## 2020-05-08 NOTE — Telephone Encounter (Signed)
PROLIA GIVEN 12/05/2019 NEXT INJECTION 06/04/2020

## 2020-05-13 DIAGNOSIS — F4321 Adjustment disorder with depressed mood: Secondary | ICD-10-CM | POA: Diagnosis not present

## 2020-05-15 ENCOUNTER — Telehealth: Payer: Self-pay | Admitting: *Deleted

## 2020-05-15 ENCOUNTER — Other Ambulatory Visit: Payer: Self-pay | Admitting: Nurse Practitioner

## 2020-05-15 DIAGNOSIS — B009 Herpesviral infection, unspecified: Secondary | ICD-10-CM

## 2020-05-15 NOTE — Telephone Encounter (Signed)
Prolia insurance verification has been sent awaiting Summary of benefits  

## 2020-05-19 NOTE — Telephone Encounter (Addendum)
Deductible n/a  OOP MAX n/a  Annual exam 03/12/2020 TW  Calcium  9.1           Date 07/05/2019  Upcoming dental procedures NO  Prior Authorization needed yes previous prior auth extended until 03/21/2021 [phone] medication intake team auth #[id number]  Pt estimated Cost $0       Coverage Details: 100% covered

## 2020-06-02 ENCOUNTER — Other Ambulatory Visit: Payer: Self-pay | Admitting: Nurse Practitioner

## 2020-06-02 DIAGNOSIS — B009 Herpesviral infection, unspecified: Secondary | ICD-10-CM

## 2020-06-03 DIAGNOSIS — F332 Major depressive disorder, recurrent severe without psychotic features: Secondary | ICD-10-CM | POA: Diagnosis not present

## 2020-06-05 DIAGNOSIS — F4321 Adjustment disorder with depressed mood: Secondary | ICD-10-CM | POA: Diagnosis not present

## 2020-06-13 ENCOUNTER — Ambulatory Visit (INDEPENDENT_AMBULATORY_CARE_PROVIDER_SITE_OTHER): Payer: Medicare PPO | Admitting: *Deleted

## 2020-06-13 ENCOUNTER — Other Ambulatory Visit: Payer: Self-pay

## 2020-06-13 DIAGNOSIS — M810 Age-related osteoporosis without current pathological fracture: Secondary | ICD-10-CM

## 2020-06-13 MED ORDER — DENOSUMAB 60 MG/ML ~~LOC~~ SOSY
60.0000 mg | PREFILLED_SYRINGE | Freq: Once | SUBCUTANEOUS | Status: AC
  Administered 2020-06-13: 60 mg via SUBCUTANEOUS

## 2020-06-16 DIAGNOSIS — F4321 Adjustment disorder with depressed mood: Secondary | ICD-10-CM | POA: Diagnosis not present

## 2020-06-22 ENCOUNTER — Encounter: Payer: Self-pay | Admitting: Nurse Practitioner

## 2020-06-22 DIAGNOSIS — B009 Herpesviral infection, unspecified: Secondary | ICD-10-CM

## 2020-06-23 MED ORDER — VALACYCLOVIR HCL 500 MG PO TABS: 500.0000 mg | ORAL_TABLET | Freq: Two times a day (BID) | ORAL | 0 refills | Status: DC

## 2020-06-30 ENCOUNTER — Other Ambulatory Visit: Payer: Self-pay | Admitting: Nurse Practitioner

## 2020-06-30 DIAGNOSIS — B009 Herpesviral infection, unspecified: Secondary | ICD-10-CM

## 2020-06-30 DIAGNOSIS — F4321 Adjustment disorder with depressed mood: Secondary | ICD-10-CM | POA: Diagnosis not present

## 2020-06-30 NOTE — Telephone Encounter (Signed)
AEX 03/12/2020

## 2020-07-02 ENCOUNTER — Other Ambulatory Visit: Payer: Self-pay

## 2020-07-02 ENCOUNTER — Other Ambulatory Visit: Payer: Self-pay | Admitting: Nurse Practitioner

## 2020-07-02 ENCOUNTER — Ambulatory Visit (INDEPENDENT_AMBULATORY_CARE_PROVIDER_SITE_OTHER): Payer: Medicare PPO

## 2020-07-02 DIAGNOSIS — Z780 Asymptomatic menopausal state: Secondary | ICD-10-CM

## 2020-07-02 DIAGNOSIS — M810 Age-related osteoporosis without current pathological fracture: Secondary | ICD-10-CM

## 2020-07-10 ENCOUNTER — Encounter: Payer: Self-pay | Admitting: Nurse Practitioner

## 2020-07-10 ENCOUNTER — Other Ambulatory Visit: Payer: Self-pay | Admitting: Nurse Practitioner

## 2020-07-10 DIAGNOSIS — B009 Herpesviral infection, unspecified: Secondary | ICD-10-CM

## 2020-07-10 MED ORDER — VALACYCLOVIR HCL 500 MG PO TABS: 500.0000 mg | ORAL_TABLET | Freq: Every day | ORAL | 3 refills | Status: DC

## 2020-07-17 DIAGNOSIS — F4321 Adjustment disorder with depressed mood: Secondary | ICD-10-CM | POA: Diagnosis not present

## 2020-07-23 DIAGNOSIS — E559 Vitamin D deficiency, unspecified: Secondary | ICD-10-CM | POA: Diagnosis not present

## 2020-07-23 DIAGNOSIS — E7800 Pure hypercholesterolemia, unspecified: Secondary | ICD-10-CM | POA: Diagnosis not present

## 2020-07-23 DIAGNOSIS — Z0000 Encounter for general adult medical examination without abnormal findings: Secondary | ICD-10-CM | POA: Diagnosis not present

## 2020-07-23 DIAGNOSIS — Z1159 Encounter for screening for other viral diseases: Secondary | ICD-10-CM | POA: Diagnosis not present

## 2020-07-23 DIAGNOSIS — Z79899 Other long term (current) drug therapy: Secondary | ICD-10-CM | POA: Diagnosis not present

## 2020-07-23 DIAGNOSIS — Z1389 Encounter for screening for other disorder: Secondary | ICD-10-CM | POA: Diagnosis not present

## 2020-07-25 DIAGNOSIS — Z0000 Encounter for general adult medical examination without abnormal findings: Secondary | ICD-10-CM | POA: Diagnosis not present

## 2020-07-25 DIAGNOSIS — B009 Herpesviral infection, unspecified: Secondary | ICD-10-CM | POA: Diagnosis not present

## 2020-07-25 DIAGNOSIS — E7800 Pure hypercholesterolemia, unspecified: Secondary | ICD-10-CM | POA: Diagnosis not present

## 2020-07-25 DIAGNOSIS — R413 Other amnesia: Secondary | ICD-10-CM | POA: Diagnosis not present

## 2020-07-25 DIAGNOSIS — M810 Age-related osteoporosis without current pathological fracture: Secondary | ICD-10-CM | POA: Diagnosis not present

## 2020-07-25 DIAGNOSIS — Z79899 Other long term (current) drug therapy: Secondary | ICD-10-CM | POA: Diagnosis not present

## 2020-07-25 DIAGNOSIS — E559 Vitamin D deficiency, unspecified: Secondary | ICD-10-CM | POA: Diagnosis not present

## 2020-07-31 DIAGNOSIS — F4321 Adjustment disorder with depressed mood: Secondary | ICD-10-CM | POA: Diagnosis not present

## 2020-08-27 DIAGNOSIS — F4321 Adjustment disorder with depressed mood: Secondary | ICD-10-CM | POA: Diagnosis not present

## 2020-09-09 DIAGNOSIS — F4321 Adjustment disorder with depressed mood: Secondary | ICD-10-CM | POA: Diagnosis not present

## 2020-09-28 ENCOUNTER — Other Ambulatory Visit: Payer: Self-pay | Admitting: Nurse Practitioner

## 2020-09-28 DIAGNOSIS — L900 Lichen sclerosus et atrophicus: Secondary | ICD-10-CM

## 2020-09-30 DIAGNOSIS — F4321 Adjustment disorder with depressed mood: Secondary | ICD-10-CM | POA: Diagnosis not present

## 2020-10-19 ENCOUNTER — Other Ambulatory Visit: Payer: Self-pay | Admitting: Nurse Practitioner

## 2020-10-19 DIAGNOSIS — B009 Herpesviral infection, unspecified: Secondary | ICD-10-CM

## 2020-10-30 DIAGNOSIS — F4321 Adjustment disorder with depressed mood: Secondary | ICD-10-CM | POA: Diagnosis not present

## 2020-10-31 ENCOUNTER — Emergency Department (HOSPITAL_COMMUNITY): Payer: Medicare PPO

## 2020-10-31 ENCOUNTER — Other Ambulatory Visit: Payer: Self-pay

## 2020-10-31 ENCOUNTER — Emergency Department (HOSPITAL_COMMUNITY)
Admission: EM | Admit: 2020-10-31 | Discharge: 2020-10-31 | Disposition: A | Discharge status: Home / Self Care | Payer: Medicare PPO | Attending: Emergency Medicine | Admitting: Emergency Medicine

## 2020-10-31 DIAGNOSIS — R55 Syncope and collapse: Secondary | ICD-10-CM | POA: Diagnosis not present

## 2020-10-31 DIAGNOSIS — R079 Chest pain, unspecified: Secondary | ICD-10-CM | POA: Diagnosis not present

## 2020-10-31 DIAGNOSIS — J939 Pneumothorax, unspecified: Secondary | ICD-10-CM | POA: Diagnosis not present

## 2020-10-31 DIAGNOSIS — R42 Dizziness and giddiness: Secondary | ICD-10-CM | POA: Diagnosis not present

## 2020-10-31 DIAGNOSIS — I700 Atherosclerosis of aorta: Secondary | ICD-10-CM | POA: Diagnosis not present

## 2020-10-31 DIAGNOSIS — R531 Weakness: Secondary | ICD-10-CM | POA: Diagnosis not present

## 2020-10-31 DIAGNOSIS — R0789 Other chest pain: Secondary | ICD-10-CM | POA: Diagnosis not present

## 2020-10-31 DIAGNOSIS — R000 Tachycardia, unspecified: Secondary | ICD-10-CM | POA: Diagnosis not present

## 2020-10-31 LAB — CBC WITH DIFFERENTIAL/PLATELET
Abs Immature Granulocytes: 0 10*3/uL (ref 0.00–0.07)
Basophils Absolute: 0 10*3/uL (ref 0.0–0.1)
Basophils Relative: 0 %
Eosinophils Absolute: 0 10*3/uL (ref 0.0–0.5)
Eosinophils Relative: 1 %
HCT: 39.4 % (ref 36.0–46.0)
Hemoglobin: 13.1 g/dL (ref 12.0–15.0)
Immature Granulocytes: 0 %
Lymphocytes Relative: 17 %
Lymphs Abs: 0.8 10*3/uL (ref 0.7–4.0)
MCH: 33.7 pg (ref 26.0–34.0)
MCHC: 33.2 g/dL (ref 30.0–36.0)
MCV: 101.3 fL — ABNORMAL HIGH (ref 80.0–100.0)
Monocytes Absolute: 0.4 10*3/uL (ref 0.1–1.0)
Monocytes Relative: 8 %
Neutro Abs: 3.4 10*3/uL (ref 1.7–7.7)
Neutrophils Relative %: 74 %
Platelets: 270 10*3/uL (ref 150–400)
RBC: 3.89 MIL/uL (ref 3.87–5.11)
RDW: 13.2 % (ref 11.5–15.5)
WBC: 4.6 10*3/uL (ref 4.0–10.5)
nRBC: 0 % (ref 0.0–0.2)

## 2020-10-31 LAB — COMPREHENSIVE METABOLIC PANEL
ALT: 16 U/L (ref 0–44)
AST: 20 U/L (ref 15–41)
Albumin: 3.8 g/dL (ref 3.5–5.0)
Alkaline Phosphatase: 30 U/L — ABNORMAL LOW (ref 38–126)
Anion gap: 7 (ref 5–15)
BUN: 21 mg/dL (ref 8–23)
CO2: 24 mmol/L (ref 22–32)
Calcium: 8.3 mg/dL — ABNORMAL LOW (ref 8.9–10.3)
Chloride: 108 mmol/L (ref 98–111)
Creatinine, Ser: 0.71 mg/dL (ref 0.44–1.00)
GFR, Estimated: >60 mL/min (ref >60)
Glucose, Bld: 90 mg/dL (ref 70–99)
Potassium: 4 mmol/L (ref 3.5–5.1)
Sodium: 139 mmol/L (ref 135–145)
Total Bilirubin: 0.4 mg/dL (ref 0.3–1.2)
Total Protein: 6.2 g/dL — ABNORMAL LOW (ref 6.5–8.1)

## 2020-10-31 LAB — URINALYSIS, ROUTINE W REFLEX MICROSCOPIC
Bilirubin Urine: NEGATIVE
Glucose, UA: NEGATIVE mg/dL
Hgb urine dipstick: NEGATIVE
Ketones, ur: 20 mg/dL — AB
Leukocytes,Ua: NEGATIVE
Nitrite: NEGATIVE
Protein, ur: NEGATIVE mg/dL
Specific Gravity, Urine: 1.013 (ref 1.005–1.030)
pH: 6 (ref 5.0–8.0)

## 2020-10-31 LAB — TROPONIN I (HIGH SENSITIVITY)
Troponin I (High Sensitivity): 25 ng/L — ABNORMAL HIGH (ref <18)
Troponin I (High Sensitivity): 21 ng/L — ABNORMAL HIGH (ref <18)

## 2020-10-31 LAB — D-DIMER, QUANTITATIVE: D-Dimer, Quant: 0.37 ug/mL-FEU (ref 0.00–0.50)

## 2020-10-31 MED ORDER — ONDANSETRON HCL 4 MG/2ML IJ SOLN
4.0000 mg | Freq: Once | INTRAMUSCULAR | Status: AC
  Administered 2020-10-31: 4 mg via INTRAVENOUS
  Filled 2020-10-31: qty 2

## 2020-10-31 MED ORDER — SODIUM CHLORIDE 0.9 % IV BOLUS
1000.0000 mL | Freq: Once | INTRAVENOUS | Status: AC
  Administered 2020-10-31: 1000 mL via INTRAVENOUS

## 2020-10-31 MED ORDER — SODIUM CHLORIDE 0.9 % IV BOLUS: 500.0000 mL | Freq: Once | INTRAVENOUS | Status: DC

## 2020-10-31 NOTE — ED Provider Notes (Signed)
[facility]   CSN: [id number] Arrival date & time: 10/31/20  1537     History Chief Complaint  Patient presents with   Weakness    Miranda MallowKaren Lisk Pierce is a 70 y.o. female.  She is here with a complaint of chest discomfort associated with nausea and diaphoresis that started around 11 AM.  She says it hurt to stand up straight.  She drove to do some shopping but then felt like she might pass out so she drove to urgent care where they told her they could not treat her.  EMS states the patient was orthostatic and gave her some IV fluids.  Currently she denies any chest pain just feels weak all over.  Nausea is resolved also.  Felt well this morning.  No prior history of cardiac disease.  The history is provided by the patient.  Chest Pain Pain location:  Substernal area and epigastric Pain quality: aching   Pain radiates to:  Does not radiate Pain severity:  Severe Onset quality:  Gradual Duration:  5 hours Timing:  Constant Progression:  Resolved Chronicity:  New Relieved by:  None tried Worsened by:  Certain positions Ineffective treatments:  None tried Associated symptoms: diaphoresis and nausea   Associated symptoms: no abdominal pain, no back pain, no cough, no fever, no numbness, no shortness of breath and no vomiting       Past Medical History:  Diagnosis Date   ADD (attention deficit disorder)    Arthritis    Cervical dysplasia    Depression    Lichen sclerosus    Migraines    Osteoporosis 03/2018   T score -2.8 statistically significant decline at all measured sites   Thyroid disease     Patient Active Problem List   Diagnosis Date Noted   HSV-1 infection 06/08/2017   Cervical dysplasia    Lichen sclerosus    Osteoporosis    Migraines    Arthritis    ADD (attention deficit disorder)     Past Surgical History:  Procedure Laterality Date   Colon polyp excised     COLPOSCOPY     GYNECOLOGIC CRYOSURGERY      HERNIA REPAIR     TUBAL LIGATION       OB History     Gravida  4   Para  4   Term  4   Preterm      AB      Living  4      SAB      IAB      Ectopic      Multiple      Live Births              Family History  Problem Relation Age of Onset   Lung cancer Mother    Heart disease Father    Bone cancer Brother     Social History   Tobacco Use   Smoking status: Never   Smokeless tobacco: Never  Vaping Use   Vaping Use: Never used  Substance Use Topics   Alcohol use: Yes    Alcohol/week: 0.0 standard drinks    Comment: rare   Drug use: No    Home Medications Prior to Admission medications   Medication Sig Start Date End Date Taking? Authorizing Provider  Amphetamine-Dextroamphetamine (ADDERALL PO) Take by mouth.    [provider]  Cholecalciferol (VITAMIN D3) 2000 UNITS capsule Take 2,000 Units by mouth daily.  [provider]  clobetasol ointment (TEMOVATE) 0.05 % APPLY 1 APPLICATION TOPICALLY 2 (TWO) TIMES DAILY. APPLY EXTERNALLY TWICE A DAY FOR 4 WEEKS, THEN EVERY OTHER DAY FOR 4 WEEKS, THEN TWICE A WEEK FOR 4 WEEKS. 09/30/20   Wyline Beady A, NP  DULoxetine HCl (CYMBALTA PO) Take 60 mg by mouth.     [provider]  LamoTRIgine (LAMICTAL PO) Take 200 mg by mouth.     [provider]  valACYclovir (VALTREX) 500 MG tablet Take 1 tablet (500 mg total) by mouth daily. 07/10/20   Olivia Mackie, NP    Allergies    Fosamax [alendronate]  Review of Systems   Review of Systems  Constitutional:  Positive for diaphoresis. Negative for fever.  HENT:  Negative for sore throat.   Eyes:  Negative for visual disturbance.  Respiratory:  Negative for cough and shortness of breath.   Cardiovascular:  Positive for chest pain.  Gastrointestinal:  Positive for nausea. Negative for abdominal pain and vomiting.  Genitourinary:  Negative for dysuria.  Musculoskeletal:  Negative for back pain.  Skin:  Negative for  rash.  Neurological:  Negative for numbness.   Physical Exam Updated Vital Signs BP (!) 143/94 (BP Location: Left Arm)   Pulse 82   Temp 98.2 F (36.8 C) (Oral)   Resp 14   Ht 5\' 1"  (1.549 m)   Wt 51.7 kg   SpO2 100%   BMI 21.54 kg/m   Physical Exam Vitals and nursing note reviewed.  Constitutional:      General: She is not in acute distress.    Appearance: Normal appearance. She is well-developed.  HENT:     Head: Normocephalic and atraumatic.  Eyes:     Conjunctiva/sclera: Conjunctivae normal.  Cardiovascular:     Rate and Rhythm: Normal rate and regular rhythm.     Heart sounds: No murmur heard. Pulmonary:     Effort: Pulmonary effort is normal. No respiratory distress.     Breath sounds: Normal breath sounds.  Abdominal:     Palpations: Abdomen is soft.     Tenderness: There is no abdominal tenderness. There is no guarding or rebound.  Musculoskeletal:        General: No deformity or signs of injury. Normal range of motion.     Cervical back: Neck supple.  Skin:    General: Skin is warm and dry.     Capillary Refill: Capillary refill takes less than 2 seconds.  Neurological:     General: No focal deficit present.     Mental Status: She is alert and oriented to person, place, and time.     Sensory: No sensory deficit.     Motor: No weakness.    ED Results / Procedures / Treatments   Labs (all labs ordered are listed, but only abnormal results are displayed) Labs Reviewed  COMPREHENSIVE METABOLIC PANEL - Abnormal; Notable for the following components:      Result Value   Calcium 8.3 (*)    Total Protein 6.2 (*)    Alkaline Phosphatase 30 (*)    All other components within normal limits  CBC WITH DIFFERENTIAL/PLATELET - Abnormal; Notable for the following components:   MCV 101.3 (*)    All other components within normal limits  URINALYSIS, ROUTINE W REFLEX MICROSCOPIC - Abnormal; Notable for the following components:   Ketones, ur 20 (*)    All other  components within normal limits  TROPONIN I (HIGH SENSITIVITY) - Abnormal; Notable for  the following components:   Troponin I (High Sensitivity) 25 (*)    All other components within normal limits  TROPONIN I (HIGH SENSITIVITY) - Abnormal; Notable for the following components:   Troponin I (High Sensitivity) 21 (*)    All other components within normal limits  D-DIMER, QUANTITATIVE    EKG EKG Interpretation  Date/Time:  Friday October 31 2020 16:22:04 EDT Ventricular Rate:  79 PR Interval:  157 QRS Duration: 93 QT Interval:  436 QTC Calculation: 500 R Axis:   -7 Text Interpretation: Sinus rhythm Anteroseptal infarct, age indeterminate No old tracing to compare Confirmed by Meridee Score [phone]) on 10/31/2020 4:26:02 PM  Radiology CT Chest Wo Contrast  Result Date: 10/31/2020 CLINICAL DATA:  Right-sided pneumothorax on recent chest x-ray EXAM: CT CHEST WITHOUT CONTRAST TECHNIQUE: Multidetector CT imaging of the chest was performed following the standard protocol without IV contrast. COMPARISON:  Chest x-ray from earlier in the same day. FINDINGS: Cardiovascular: Limited due to lack of IV contrast. Scattered atherosclerotic calcifications of the aorta are noted. No aneurysmal dilatation is seen. No cardiac enlargement is noted. Mediastinum/Nodes: Thoracic inlet is within normal limits. No sizable hilar or mediastinal adenopathy is noted. The esophagus as visualized is within normal limits. Lungs/Pleura: Lungs are well aerated bilaterally. The abnormality seen on the recent chest x-ray is not borne out on this exam as no pneumothorax is identified. This may have simply represented a skin fold. No focal infiltrate or sizable effusion is noted. Minimal dependent atelectatic changes are seen. No sizable parenchymal nodules are seen. Upper Abdomen: Visualized upper abdomen is unremarkable. Musculoskeletal: Degenerative changes of the thoracic spine are noted. No acute rib abnormality is noted.  IMPRESSION: No evidence of right-sided pneumothorax. Abnormality seen on recent chest x-ray likely represented a skin fold. No other focal abnormality is noted. Aortic Atherosclerosis (ICD10-I70.0). Electronically Signed   By: Alcide Clever M.D.   On: 10/31/2020 19:11   DG Chest Port 1 View  Result Date: 10/31/2020 CLINICAL DATA:  Chest pain weakness EXAM: PORTABLE CHEST 1 VIEW COMPARISON:  None. FINDINGS: No focal consolidation or pleural effusion. Normal cardiomediastinal silhouette with aortic atherosclerosis. Possible small right apical pneumothorax, estimated at less than 10%, versus less likely skin fold artifact. IMPRESSION: Possible small right apical pneumothorax versus less likely skin fold artifact. Radiographic follow-up recommended, alternatively CT could be considered. Critical Value/emergent results were called by telephone at the time of interpretation on 10/31/2020 at 6:22 pm to provider [facility] , who verbally acknowledged these results. Electronically Signed   By: Jasmine Pang M.D.   On: 10/31/2020 18:22    Procedures Procedures   Medications Ordered in ED Medications  sodium chloride 0.9 % bolus 1,000 mL (0 mLs Intravenous Stopped 10/31/20 1705)  ondansetron (ZOFRAN) injection 4 mg (4 mg Intravenous Given 10/31/20 1651)    ED Course  I have reviewed the triage vital signs and the nursing notes.  Pertinent labs & imaging results that were available during my care of the patient were reviewed by me and considered in my medical decision making (see chart for details).  Clinical Course as of 11/01/20 1022  Fri Oct 31, 2020  1816 Chest x-ray ordered and interpreted by me as no acute infiltrates no pneumothorax. [MB]  [id number] Radiology called me and she said the patient has a possible small apical pneumothorax on the right.  Have ordered her chest CT. [MB]  1847 Patient states she is feeling better although has not ambulated yet. [MB]  2046 She  states she is feeling better and  her daughter is coming to pick her up.  She is consistent with her daughter tonight which is reassuring her.  She said [place] 2 weeks and has lost 5 pounds so she is not too surprised that she may be volume depleted. [MB]    Clinical Course User Index [MB] Terrilee Files, MD   MDM Rules/Calculators/A&P                          This patient complains of generalized weakness near syncope chest pain; this involves an extensive number of treatment Options and is a complaint that carries with it a high risk of complications and Morbidity. The differential includes dehydration, arrhythmia, metabolic derangement, anemia, ACS, PE  I ordered, reviewed and interpreted labs, which included CBC with normal white count normal hemoglobin, chemistries and LFTs fairly normal, urinalysis without signs of infection, troponins mildly elevated but flat, D-dimer negative I ordered medication IV fluids and Zofran with improvement in her symptoms I ordered imaging studies which included chest x-ray and chest CT and I independently    visualized and interpreted imaging which showed no acute findings Previous records obtained and reviewed in epic, no recent admissions  After the interventions stated above, I reevaluated the patient and found patient symptoms to be improved.  She is ambulated in the department without any difficulty.  She is comfortable plan for discharge and follow-up with primary care doctor.  Return instructions discussed   Final Clinical Impression(s) / ED Diagnoses Final diagnoses:  Near syncope  Nonspecific chest pain  Generalized weakness    Rx / DC Orders ED Discharge Orders     None        Terrilee Files, MD 11/01/20 1024

## 2020-10-31 NOTE — ED Notes (Signed)
Pt ambulated to bathroom and back. Pt was able to ambulate independently but had a couple occurrences where she appeared to stumble but was able to redirect and ambulate straight.

## 2020-10-31 NOTE — ED Notes (Signed)
Pt ambulated to RR w/out assistance

## 2020-10-31 NOTE — ED Notes (Signed)
Patient transported to CT 

## 2020-10-31 NOTE — ED Triage Notes (Signed)
Pt arrived via EMS from urgent care. Pt reports she went to urgent care after having a sudden onset of full body weakness around 1300 today. Pt also reports she had an unsteady balance as well while feeling light headed with some discomfort in the chest. Pt denied CP on arrival to ED and denies having the chest discomfort she had previously. Pt denies SOB. Caox4. EMS reports pt was positive for orthostatic changes with increased HR. EMS admin - 500 NS bolus. Pt reports feeling "better" but scared to stand now.   EMS VS BP 135/67  HR 90 SpO2 97% RA  CBG 96

## 2020-10-31 NOTE — Discharge Instructions (Addendum)
You were seen in the emergency department for evaluation of weakness unsteadiness and feeling like you might faint.  You had lab work done along with a chest x-ray EKG and a CAT scan of your chest that did not show any significant findings.  You felt better after some IV fluids.  Please continue to stay hydrated and follow-up with your primary care doctor.  Return to the emergency department for any worsening or concerning symptoms.

## 2020-11-11 ENCOUNTER — Telehealth: Payer: Self-pay | Admitting: *Deleted

## 2020-11-11 NOTE — Telephone Encounter (Signed)
Pt will be due for her Prolia. Last calcium low at 8.3 I will ask Provider how we should proceed before giving pt her Prolia.

## 2020-11-11 NOTE — Telephone Encounter (Signed)
Looks like her calcium was checked during her ED visit 8/12 where she had received IV fluids, so this is likely from fluid overload and not a true hypocalcemia. She can proceed with the Prolia. Thank you.

## 2020-11-11 NOTE — Telephone Encounter (Addendum)
Deductible N/A   OOP MAX N/A  Annual exam 03/12/2020  Calcium   8.3       Date 10/31/2020  Upcoming dental procedures NO  Prior Authorization needed YES on file until 03/21/2021 Berkley Harvey #[id number]  Pt estimated Cost $0   PT 01/15/2021 (pt suggested this apt because she will be out of town until this week)    Coverage Details: Product will be covered at 100% of the contracted rate. No deductible or coinsurance applies

## 2020-11-11 NOTE — Telephone Encounter (Signed)
PROLIA GIVEN 06/13/2020 NEXT INJECTION 12/15/2020  

## 2020-11-13 DIAGNOSIS — R413 Other amnesia: Secondary | ICD-10-CM | POA: Diagnosis not present

## 2020-11-13 DIAGNOSIS — Z09 Encounter for follow-up examination after completed treatment for conditions other than malignant neoplasm: Secondary | ICD-10-CM | POA: Diagnosis not present

## 2020-11-13 DIAGNOSIS — B009 Herpesviral infection, unspecified: Secondary | ICD-10-CM | POA: Diagnosis not present

## 2020-11-13 DIAGNOSIS — E860 Dehydration: Secondary | ICD-10-CM | POA: Diagnosis not present

## 2020-11-13 DIAGNOSIS — R682 Dry mouth, unspecified: Secondary | ICD-10-CM | POA: Diagnosis not present

## 2020-11-13 DIAGNOSIS — R748 Abnormal levels of other serum enzymes: Secondary | ICD-10-CM | POA: Diagnosis not present

## 2020-11-13 DIAGNOSIS — R059 Cough, unspecified: Secondary | ICD-10-CM | POA: Diagnosis not present

## 2020-12-09 DIAGNOSIS — F332 Major depressive disorder, recurrent severe without psychotic features: Secondary | ICD-10-CM | POA: Diagnosis not present

## 2020-12-31 DIAGNOSIS — F4321 Adjustment disorder with depressed mood: Secondary | ICD-10-CM | POA: Diagnosis not present

## 2021-01-02 DIAGNOSIS — Z6821 Body mass index (BMI) 21.0-21.9, adult: Secondary | ICD-10-CM | POA: Diagnosis not present

## 2021-01-02 DIAGNOSIS — S8012XD Contusion of left lower leg, subsequent encounter: Secondary | ICD-10-CM | POA: Diagnosis not present

## 2021-01-11 ENCOUNTER — Encounter: Payer: Self-pay | Admitting: Nurse Practitioner

## 2021-01-11 DIAGNOSIS — B009 Herpesviral infection, unspecified: Secondary | ICD-10-CM

## 2021-01-12 NOTE — Telephone Encounter (Signed)
Wyline Beady, NP patient. AEX 03/12/20 Scheduled AEX 03/25/20

## 2021-01-15 ENCOUNTER — Ambulatory Visit: Payer: Medicare PPO

## 2021-01-22 ENCOUNTER — Other Ambulatory Visit: Payer: Self-pay

## 2021-01-22 ENCOUNTER — Encounter: Payer: Self-pay | Admitting: Cardiology

## 2021-01-22 ENCOUNTER — Ambulatory Visit: Payer: Medicare PPO | Admitting: Cardiology

## 2021-01-22 VITALS — BP 110/70 | HR 84 | Ht 61.0 in | Wt 117.0 lb

## 2021-01-22 DIAGNOSIS — R9431 Abnormal electrocardiogram [ECG] [EKG]: Secondary | ICD-10-CM

## 2021-01-22 DIAGNOSIS — I700 Atherosclerosis of aorta: Secondary | ICD-10-CM | POA: Diagnosis not present

## 2021-01-22 DIAGNOSIS — Z01812 Encounter for preprocedural laboratory examination: Secondary | ICD-10-CM | POA: Diagnosis not present

## 2021-01-22 DIAGNOSIS — R072 Precordial pain: Secondary | ICD-10-CM

## 2021-01-22 DIAGNOSIS — R55 Syncope and collapse: Secondary | ICD-10-CM | POA: Diagnosis not present

## 2021-01-22 DIAGNOSIS — F4321 Adjustment disorder with depressed mood: Secondary | ICD-10-CM | POA: Diagnosis not present

## 2021-01-22 DIAGNOSIS — R079 Chest pain, unspecified: Secondary | ICD-10-CM | POA: Diagnosis not present

## 2021-01-22 MED ORDER — METOPROLOL TARTRATE 100 MG PO TABS: 100.0000 mg | ORAL_TABLET | ORAL | 0 refills | Status: DC

## 2021-01-22 NOTE — Progress Notes (Signed)
Cardiology Office Note:    Date:  01/22/2021   ID:  Miranda Pierce, Miranda Pierce 05/28/1950, MRN [id number]  PCP:  Miranda Floro, MD   [facility] Providers Cardiologist:  None     Referring MD: Miranda Floro, MD   History of Present Illness:    Miranda Pierce is a 69 y.o. female here for the evaluation of elevated Troponin.  She was admitted in the ED 10/31/2020 for near syncopal symptoms including chest discomfort, diaphoresis, and nausea.  In reviewing ER records from 10/31/2020, she started to complain of chest discomfort associated with nausea and diaphoresis at around 11 AM.  It hurt to stand straight up she states.  She ended up driving and doing some shopping but then felt like she was going to pass out so she went to the urgent care center where they told her that she needed to go to the emergency room.  EMS thought that she was orthostatic and hypotensive and gave her some IV fluids and she felt all over.  Her nausea did end up resolving.  She has never had any prior cardiac issues.  Her symptoms lasted several hours.  Her troponin came back at 25 and 21.  Her EKG personally reviewed shows sinus rhythm anteroseptal infarct, T wave inversions noted in V2 through V4.  She ended up having a chest CT to exclude small pneumothorax which was thought to be there on chest x-ray.  Overall normal.  D-dimer was negative.   Today: Following her recent near-syncopal episode she went to two urgent cares. She was noted to be severely dehydrated.  Her episodes of near-syncope have been ongoing intermittently for 15-20 years. After a trigger such as becoming overheated or dehydrated, she experiences symptoms including pallor, nausea, and diaphoresis. Typically she is able to tell when an episode is coming on, and she will lie down and cool herself. At this time she states her episodes are more frequent than twice a month.Currently she is making sure she is hydrated.  She went to a friend's  farm, and it was hot but she said that she was dehydrated since it was hot.  She can tell whenever she is about to have syncope. She experiences this Since her  She reports she never smoked or taken drugs.  She has no heart diseases.  She denies any palpitations, chest pain, or shortness of breath. No lightheadedness, headaches, orthopnea, or PND. Also has no lower extremity edema or exertional symptoms.  Dad died at 54 years from Heart Attack, Mother died of cancer.  Her children also have Syncope.  (+)Near-Syncope (+) Dehydration  Past Medical History:  Diagnosis Date   Abnormal laboratory test    ADD (attention deficit disorder)    Arthritis    Cervical dysplasia    Contusion of left foot    Depression    DEPRESSION    Herpes    HSV-2 (herpes simplex virus 2) infection    Insomnia    Lichen sclerosus    Memory problem    Migraines    Osteoarthritis    OF LUMBAR SPINE   Osteoporosis 03/2018   T score -2.8 statistically significant decline at all measured sites   Thyroid disease    Vitamin D deficiency     Past Surgical History:  Procedure Laterality Date   Colon polyp excised     COLPOSCOPY     GYNECOLOGIC CRYOSURGERY     HERNIA REPAIR     TUBAL LIGATION  Current Medications: Current Meds  Medication Sig   Amphetamine-Dextroamphetamine (ADDERALL PO) Take by mouth.   Cholecalciferol (VITAMIN D3) 2000 UNITS capsule Take 2,000 Units by mouth daily.   clobetasol ointment (TEMOVATE) 0.05 % APPLY 1 APPLICATION TOPICALLY 2 (TWO) TIMES DAILY. APPLY EXTERNALLY TWICE A DAY FOR 4 WEEKS, THEN EVERY OTHER DAY FOR 4 WEEKS, THEN TWICE A WEEK FOR 4 WEEKS.   denosumab (PROLIA) 60 MG/ML SOSY injection Inject 60 mg into the skin every 6 (six) months.   DULoxetine (CYMBALTA) 60 MG capsule Take 60 mg by mouth 2 (two) times daily.   lamoTRIgine (LAMICTAL) 200 MG tablet Take 200 mg by mouth 2 (two) times daily.   metoprolol tartrate (LOPRESSOR) 100 MG tablet Take 1 tablet  (100 mg total) by mouth as directed. Take 1 tablet (2) hours before the CT scan   valACYclovir (VALTREX) 500 MG tablet Take 1 tablet (500 mg total) by mouth daily.     Allergies:   Fosamax [alendronate]   Social History   Socioeconomic History   Marital status: Divorced    Spouse name: Not on file   Number of children: Not on file   Years of education: Not on file   Highest education level: Not on file  Occupational History   Not on file  Tobacco Use   Smoking status: Never   Smokeless tobacco: Never  Vaping Use   Vaping Use: Never used  Substance and Sexual Activity   Alcohol use: Yes    Alcohol/week: 0.0 standard drinks    Comment: rare   Drug use: No   Sexual activity: Not Currently    Birth control/protection: Surgical    Comment: DECLINED INSURANCE QUESTIONS  Other Topics Concern   Not on file  Social History Narrative   Not on file   Social Determinants of Health   Financial Resource Strain: Not on file  Food Insecurity: Not on file  Transportation Needs: Not on file  Physical Activity: Not on file  Stress: Not on file  Social Connections: Not on file     Family History: The patient's family history includes Bone cancer in her brother; Heart disease in her father; Lung cancer in her mother. Father died at 46 from a myocardial infarction.  ROS:   Please see the history of present illness.    +)Near-Syncope (+) Dehydration All other systems reviewed and are negative.  EKGs/Labs/Other Studies Reviewed:    The following studies were reviewed today:  CT Chest 10/31/2020: Findings: Cardiovascular: Limited due to lack of IV contrast. Scattered atherosclerotic calcifications of the aorta are noted. No aneurysmal dilatation is seen. No cardiac enlargement is noted.   Mediastinum/Nodes: Thoracic inlet is within normal limits. No sizable hilar or mediastinal adenopathy is noted. The esophagus as visualized is within normal limits.   Lungs/Pleura: Lungs  are well aerated bilaterally. The abnormality seen on the recent chest x-ray is not borne out on this exam as no pneumothorax is identified. This may have simply represented a skin fold. No focal infiltrate or sizable effusion is noted. Minimal dependent atelectatic changes are seen. No sizable parenchymal nodules are seen.   Upper Abdomen: Visualized upper abdomen is unremarkable.   Musculoskeletal: Degenerative changes of the thoracic spine are noted. No acute rib abnormality is noted.  IMPRESSION: No evidence of right-sided pneumothorax. Abnormality seen on recent chest x-ray likely represented a skin fold.   No other focal abnormality is noted.   Aortic Atherosclerosis (ICD10-I70.0).  EKG:  EKG is personally reviewed and  interpreted. 10/31/2020: Sinus rhythm 79 T wave inversion noted in the anterior leads possible old anterior septal infarct pattern.  Recent Labs: 10/31/2020: ALT 16; BUN 21; Creatinine, Ser 0.71; Hemoglobin 13.1; Platelets 270; Potassium 4.0; Sodium 139   Recent Lipid Panel    Component Value Date/Time   CHOL 184 08/24/2011 1444   TRIG 119 08/24/2011 1444   HDL 52 08/24/2011 1444   CHOLHDL 3.5 08/24/2011 1444   VLDL 24 08/24/2011 1444   LDLCALC 108 (H) 08/24/2011 1444     Risk Assessment/Calculations:          Physical Exam:    VS:  BP 110/70 (BP Location: Left Arm, Patient Position: Sitting, Cuff Size: Normal)   Pulse 84   Ht 5\' 1"  (1.549 m)   Wt 117 lb (53.1 kg)   SpO2 98%   BMI 22.11 kg/m     Wt Readings from Last 3 Encounters:  01/22/21 117 lb (53.1 kg)  10/31/20 114 lb (51.7 kg)  03/12/20 123 lb (55.8 kg)     GEN: Well nourished, well developed in no acute distress HEENT: Normal NECK: No JVD; No carotid bruits LYMPHATICS: No lymphadenopathy CARDIAC: RRR, no murmurs, rubs, gallops RESPIRATORY:  Clear to auscultation without rales, wheezing or rhonchi  ABDOMEN: Soft, non-tender, non-distended MUSCULOSKELETAL:  No edema; No  deformity  SKIN: Warm and dry NEUROLOGIC:  Alert and oriented x 3 PSYCHIATRIC:  Normal affect   ASSESSMENT:    1. Precordial pain   2. Near syncope   3. Nonspecific abnormal electrocardiogram (ECG) (EKG)   4. Chest pain of uncertain etiology   5. Aortic atherosclerosis (HCC)   6. Pre-procedure lab exam    PLAN:    In order of problems listed above: Near syncope She has felt this for about 15 to 20 years.  She knows the prodrome.  It sounds like classic vasovagal syncope.  She gets pale clammy nauseous needs to lay down and put her legs up and that will pass.  She knows to hydrate very well.  Salt liberalization would be important for her.  I will be checking an echocardiogram to ensure proper structure and function of her heart.  Nonspecific abnormal electrocardiogram (ECG) (EKG) With her T wave inversions in the anterior leads which may be ischemic as well as mildly elevated troponin in the emergency room setting and prior chest discomfort I will go ahead and check a coronary CT scan.  In review of her previous chest CT to look for pneumothorax which was negative, she does have aortic atherosclerosis at the arch as well as what looks like a tiny calcification possibly around the ostium of the left main.  Chest pain of uncertain etiology As described above.  We will go ahead and check a coronary CT scan and echocardiogram.  Aortic atherosclerosis (HCC) Going to check a coronary CT scan for further evaluation.  We will be checking aortic measurements as well.  Ultimately, recommendation may be to initiate statin therapy such as Crestor 10 mg to help stabilize plaque.  We shall see.  She works on a farm in 03/14/20 and she wants to make sure that she is well enough to do this.   Follow-up: With results of testing.  Medication Adjustments/Labs and Tests Ordered: Current medicines are reviewed at length with the patient today.  Concerns regarding medicines are outlined above.   Orders Placed This Encounter  Procedures   CT CORONARY MORPH W/CTA COR W/SCORE W/CA W/CM &/OR WO/CM   Basic metabolic  panel   ECHOCARDIOGRAM COMPLETE    Meds ordered this encounter  Medications   metoprolol tartrate (LOPRESSOR) 100 MG tablet    Sig: Take 1 tablet (100 mg total) by mouth as directed. Take 1 tablet (2) hours before the CT scan    Dispense:  1 tablet    Refill:  0    Patient Instructions  Medication Instructions:  The current medical regimen is effective;  continue present plan and medications.  *If you need a refill on your cardiac medications before your next appointment, please call your pharmacy*   Lab Work: Please have blood work today (BMP)  If you have labs (blood work) drawn today and your tests are completely normal, you will receive your results only by: MyChart Message (if you have MyChart) OR A paper copy in the mail If you have any lab test that is abnormal or we need to change your treatment, we will call you to review the results.   Testing/Procedures: Your physician has requested that you have an echocardiogram. Echocardiography is a painless test that uses sound waves to create images of your heart. It provides your doctor with information about the size and shape of your heart and how well your heart's chambers and valves are working. This procedure takes approximately one hour. There are no restrictions for this procedure.    Your cardiac CT will be scheduled at:   [facility] [address] [place], [place] [address] [phone]  Please arrive at the [facility] main entrance (entrance A) of [facility] 30 minutes prior to test start time. You can use the FREE valet parking offered at the main entrance (encouraged to control the heart rate for the test) Proceed to the [facility] Radiology Department (first floor) to check-in and test prep.  Please follow these instructions carefully (unless otherwise  directed):  On the Night Before the Test: Be sure to Drink plenty of water. Do not consume any caffeinated/decaffeinated beverages or chocolate 12 hours prior to your test. Do not take any antihistamines 12 hours prior to your test.  On the Day of the Test: Drink plenty of water until 1 hour prior to the test. Do not eat any food 4 hours prior to the test. You may take your regular medications prior to the test.  Take metoprolol (Lopressor) two hours prior to test. HOLD Furosemide/Hydrochlorothiazide morning of the test. FEMALES- please wear underwire-free bra if available, avoid dresses & tight clothing  After the Test: Drink plenty of water. After receiving IV contrast, you may experience a mild flushed feeling. This is normal. On occasion, you may experience a mild rash up to 24 hours after the test. This is not dangerous. If this occurs, you can take Benadryl 25 mg and increase your fluid intake. If you experience trouble breathing, this can be serious. If it is severe call 911 IMMEDIATELY. If it is mild, please call our office.  Please allow 2-4 weeks for scheduling of routine cardiac CTs. Some insurance companies require a pre-authorization which may delay scheduling of this test.   For non-scheduling related questions, please contact the cardiac imaging nurse navigator should you have any questions/concerns: Rockwell Alexandria, Cardiac Imaging Nurse Navigator Larey Brick, Cardiac Imaging Nurse Navigator  Heart and Vascular Services Direct Office Dial: [phone]   For scheduling needs, including cancellations and rescheduling, please call [place], [phone].  Follow-Up: At [facility], you and your health needs are our priority.  As part of our  continuing mission to provide you with exceptional heart care, we have created designated Provider Care Teams.  These Care Teams include your primary Cardiologist (physician) and Advanced Practice Providers (APPs -   Physician Assistants and Nurse Practitioners) who all work together to provide you with the care you need, when you need it.  We recommend signing up for the patient portal called "MyChart".  Sign up information is provided on this After Visit Summary.  MyChart is used to connect with patients for Virtual Visits (Telemedicine).  Patients are able to view lab/test results, encounter notes, upcoming appointments, etc.  Non-urgent messages can be sent to your provider as well.   To learn more about what you can do with MyChart, go to [email or web address].    Follow up will be based on the results of the above tests.  Thank you for choosing [facility]!!     I,Mathew Stumpf,acting as a scribe for Donato Schultz, MD.,have documented all relevant documentation on the behalf of Donato Schultz, MD,as directed by  Donato Schultz, MD while in the presence of Donato Schultz, MD.  I, Donato Schultz, MD, have reviewed all documentation for this visit. The documentation on 01/22/21 for the exam, diagnosis, procedures, and orders are all accurate and complete.   Signed, Donato Schultz, MD  01/22/2021 3:56 PM    [facility]

## 2021-01-22 NOTE — Assessment & Plan Note (Signed)
Going to check a coronary CT scan for further evaluation.  We will be checking aortic measurements as well.  Ultimately, recommendation may be to initiate statin therapy such as Crestor 10 mg to help stabilize plaque.  We shall see.

## 2021-01-22 NOTE — Assessment & Plan Note (Signed)
With her T wave inversions in the anterior leads which may be ischemic as well as mildly elevated troponin in the emergency room setting and prior chest discomfort I will go ahead and check a coronary CT scan.  In review of her previous chest CT to look for pneumothorax which was negative, she does have aortic atherosclerosis at the arch as well as what looks like a tiny calcification possibly around the ostium of the left main.

## 2021-01-22 NOTE — Assessment & Plan Note (Signed)
As described above.  We will go ahead and check a coronary CT scan and echocardiogram.

## 2021-01-22 NOTE — Assessment & Plan Note (Signed)
She has felt this for about 15 to 20 years.  She knows the prodrome.  It sounds like classic vasovagal syncope.  She gets pale clammy nauseous needs to lay down and put her legs up and that will pass.  She knows to hydrate very well.  Salt liberalization would be important for her.  I will be checking an echocardiogram to ensure proper structure and function of her heart.

## 2021-01-22 NOTE — Patient Instructions (Signed)
Medication Instructions:  The current medical regimen is effective;  continue present plan and medications.  *If you need a refill on your cardiac medications before your next appointment, please call your pharmacy*   Lab Work: Please have blood work today (BMP)  If you have labs (blood work) drawn today and your tests are completely normal, you will receive your results only by: MyChart Message (if you have MyChart) OR A paper copy in the mail If you have any lab test that is abnormal or we need to change your treatment, we will call you to review the results.   Testing/Procedures: Your physician has requested that you have an echocardiogram. Echocardiography is a painless test that uses sound waves to create images of your heart. It provides your doctor with information about the size and shape of your heart and how well your heart's chambers and valves are working. This procedure takes approximately one hour. There are no restrictions for this procedure.    Your cardiac CT will be scheduled at:   [facility] [address] [place], [place] [address] [phone]  Please arrive at the [facility] main entrance (entrance A) of [facility] 30 minutes prior to test start time. You can use the FREE valet parking offered at the main entrance (encouraged to control the heart rate for the test) Proceed to the [facility] Radiology Department (first floor) to check-in and test prep.  Please follow these instructions carefully (unless otherwise directed):  On the Night Before the Test: Be sure to Drink plenty of water. Do not consume any caffeinated/decaffeinated beverages or chocolate 12 hours prior to your test. Do not take any antihistamines 12 hours prior to your test.  On the Day of the Test: Drink plenty of water until 1 hour prior to the test. Do not eat any food 4 hours prior to the test. You may take your regular medications prior to the test.  Take  metoprolol (Lopressor) two hours prior to test. HOLD Furosemide/Hydrochlorothiazide morning of the test. FEMALES- please wear underwire-free bra if available, avoid dresses & tight clothing  After the Test: Drink plenty of water. After receiving IV contrast, you may experience a mild flushed feeling. This is normal. On occasion, you may experience a mild rash up to 24 hours after the test. This is not dangerous. If this occurs, you can take Benadryl 25 mg and increase your fluid intake. If you experience trouble breathing, this can be serious. If it is severe call 911 IMMEDIATELY. If it is mild, please call our office.  Please allow 2-4 weeks for scheduling of routine cardiac CTs. Some insurance companies require a pre-authorization which may delay scheduling of this test.   For non-scheduling related questions, please contact the cardiac imaging nurse navigator should you have any questions/concerns: Rockwell Alexandria, Cardiac Imaging Nurse Navigator Larey Brick, Cardiac Imaging Nurse Navigator [place] [facility] Direct Office Dial: [phone]   For scheduling needs, including cancellations and rescheduling, please call [place], [phone].  Follow-Up: At [facility], you and your health needs are our priority.  As part of our continuing mission to provide you with exceptional heart care, we have created designated Provider Care Teams.  These Care Teams include your primary Cardiologist (physician) and Advanced Practice Providers (APPs -  Physician Assistants and Nurse Practitioners) who all work together to provide you with the care you need, when you need it.  We recommend signing up for the patient portal called "MyChart".  Sign up information is provided on this After Visit Summary.  MyChart is used to connect with patients for Virtual Visits (Telemedicine).  Patients are able to view lab/test results, encounter notes, upcoming appointments, etc.  Non-urgent  messages can be sent to your provider as well.   To learn more about what you can do with MyChart, go to [email or web address].    Follow up will be based on the results of the above tests.  Thank you for choosing [facility]!!

## 2021-01-23 LAB — BASIC METABOLIC PANEL
BUN/Creatinine Ratio: 28 (ref 12–28)
BUN: 18 mg/dL (ref 8–27)
CO2: 25 mmol/L (ref 20–29)
Calcium: 9.5 mg/dL (ref 8.7–10.3)
Chloride: 100 mmol/L (ref 96–106)
Creatinine, Ser: 0.64 mg/dL (ref 0.57–1.00)
Glucose: 88 mg/dL (ref 70–99)
Potassium: 4.5 mmol/L (ref 3.5–5.2)
Sodium: 138 mmol/L (ref 134–144)
eGFR: 94 mL/min/{1.73_m2} (ref >59)

## 2021-01-26 NOTE — Telephone Encounter (Signed)
APPT 01/27/2021

## 2021-01-27 ENCOUNTER — Other Ambulatory Visit: Payer: Self-pay

## 2021-01-27 ENCOUNTER — Ambulatory Visit (INDEPENDENT_AMBULATORY_CARE_PROVIDER_SITE_OTHER): Payer: Medicare PPO | Admitting: *Deleted

## 2021-01-27 VITALS — BP 142/86 | HR 106 | Resp 14 | Ht 61.0 in | Wt 116.0 lb

## 2021-01-27 DIAGNOSIS — M810 Age-related osteoporosis without current pathological fracture: Secondary | ICD-10-CM

## 2021-01-27 MED ORDER — DENOSUMAB 60 MG/ML ~~LOC~~ SOSY
60.0000 mg | PREFILLED_SYRINGE | Freq: Once | SUBCUTANEOUS | Status: AC
  Administered 2021-01-27: 60 mg via SUBCUTANEOUS

## 2021-01-27 NOTE — Progress Notes (Signed)
Patient in today for Prolia injection. Patient's initial calcium level was obtained on 01-22-21.  Result: 9.5.  Last AEX: 03-12-20 TW Last BMD: 07-02-20 osteoporosis   Injection given in left arm.  Patient tolerated injection well.  Routed to provider for review.

## 2021-02-03 DIAGNOSIS — Z8249 Family history of ischemic heart disease and other diseases of the circulatory system: Secondary | ICD-10-CM | POA: Diagnosis not present

## 2021-02-03 DIAGNOSIS — R030 Elevated blood-pressure reading, without diagnosis of hypertension: Secondary | ICD-10-CM | POA: Diagnosis not present

## 2021-02-03 DIAGNOSIS — Z79620 Long term (current) use of immunosuppressive biologic: Secondary | ICD-10-CM | POA: Diagnosis not present

## 2021-02-03 DIAGNOSIS — M810 Age-related osteoporosis without current pathological fracture: Secondary | ICD-10-CM | POA: Diagnosis not present

## 2021-02-03 DIAGNOSIS — Z809 Family history of malignant neoplasm, unspecified: Secondary | ICD-10-CM | POA: Diagnosis not present

## 2021-02-03 DIAGNOSIS — F909 Attention-deficit hyperactivity disorder, unspecified type: Secondary | ICD-10-CM | POA: Diagnosis not present

## 2021-02-03 DIAGNOSIS — F329 Major depressive disorder, single episode, unspecified: Secondary | ICD-10-CM | POA: Diagnosis not present

## 2021-02-03 DIAGNOSIS — F4321 Adjustment disorder with depressed mood: Secondary | ICD-10-CM | POA: Diagnosis not present

## 2021-02-03 DIAGNOSIS — B009 Herpesviral infection, unspecified: Secondary | ICD-10-CM | POA: Diagnosis not present

## 2021-02-04 ENCOUNTER — Telehealth (HOSPITAL_COMMUNITY): Payer: Self-pay | Admitting: *Deleted

## 2021-02-04 NOTE — Telephone Encounter (Signed)
Reaching out to patient to offer assistance regarding upcoming cardiac imaging study; pt verbalizes understanding of appt date/time, parking situation and where to check in, pre-test NPO status and medications ordered, and verified current allergies; name and call back number provided for further questions should they arise  Miranda Brick RN Navigator Cardiac Imaging [facility] [phone] office [phone] cell  Patient to take 100mg  metoprolol tartrate two hours prior to cardiac CT scan and hold her adderall until after.  She is aware to arrive at 9am for her 9:30am scan.

## 2021-02-05 ENCOUNTER — Other Ambulatory Visit: Payer: Self-pay

## 2021-02-05 ENCOUNTER — Ambulatory Visit: Payer: Medicare PPO | Admitting: Nurse Practitioner

## 2021-02-05 VITALS — BP 124/84

## 2021-02-05 DIAGNOSIS — N951 Menopausal and female climacteric states: Secondary | ICD-10-CM | POA: Diagnosis not present

## 2021-02-05 DIAGNOSIS — F4321 Adjustment disorder with depressed mood: Secondary | ICD-10-CM | POA: Diagnosis not present

## 2021-02-05 DIAGNOSIS — N941 Unspecified dyspareunia: Secondary | ICD-10-CM

## 2021-02-05 MED ORDER — ESTRADIOL 0.1 MG/GM VA CREA: 1.0000 g | TOPICAL_CREAM | VAGINAL | 12 refills | Status: DC

## 2021-02-05 NOTE — Progress Notes (Signed)
   Acute Office Visit  Subjective:    Patient ID: Miranda Pierce, female    DOB: [birth year], 70 y.o.   MRN: [id number]   HPI 70 y.o. presents today for vaginal dryness and pain with intercourse. She is in a new relationship and had sexual intercourse back in August and experienced pain. He lives in [place] and she plans to visit him in January.    Review of Systems  Constitutional: Negative.   Genitourinary:  Positive for dyspareunia.       Vaginal dryness      Objective:    Physical Exam Constitutional:      Appearance: Normal appearance.  GU: Not indicated  BP 124/84  Wt Readings from Last 3 Encounters:  01/27/21 116 lb (52.6 kg)  01/22/21 117 lb (53.1 kg)  10/31/20 114 lb (51.7 kg)        Assessment & Plan:   Problem List Items Addressed This Visit   None Visit Diagnoses     Menopausal vaginal dryness    -  Primary   Relevant Medications   estradiol (ESTRACE VAGINAL) 0.1 MG/GM vaginal cream   Dyspareunia in female       Relevant Medications   estradiol (ESTRACE VAGINAL) 0.1 MG/GM vaginal cream      Plan: Discussed changes that occur with menopause and use of vaginal estrogen as first-line treatment along with lubricant during intercourse. She is agreeable and aware of risk for small amount of absorption with initial use. Recommend using nightly x 2 weeks, then every other night x 2 weeks, then twice weekly. Also recommended the use of vaginal dilators. She is agreeable and all questions answered.      Olivia Mackie DNP, 9:47 AM 02/05/2021

## 2021-02-06 ENCOUNTER — Ambulatory Visit (HOSPITAL_COMMUNITY)
Admission: RE | Admit: 2021-02-06 | Discharge: 2021-02-06 | Disposition: A | Discharge status: Home / Self Care | Payer: Medicare PPO | Source: Ambulatory Visit | Attending: Cardiology | Admitting: Cardiology

## 2021-02-06 ENCOUNTER — Encounter (HOSPITAL_COMMUNITY): Payer: Self-pay

## 2021-02-06 DIAGNOSIS — R072 Precordial pain: Secondary | ICD-10-CM | POA: Diagnosis not present

## 2021-02-06 MED ORDER — NITROGLYCERIN 0.4 MG SL SUBL
0.8000 mg | SUBLINGUAL_TABLET | Freq: Once | SUBLINGUAL | Status: AC
  Administered 2021-02-06: 0.8 mg via SUBLINGUAL

## 2021-02-06 MED ORDER — IOHEXOL 350 MG/ML SOLN
80.0000 mL | Freq: Once | INTRAVENOUS | Status: AC | PRN
  Administered 2021-02-06: 80 mL via INTRAVENOUS

## 2021-02-06 MED ORDER — NITROGLYCERIN 0.4 MG SL SUBL
SUBLINGUAL_TABLET | SUBLINGUAL | Status: AC
  Filled 2021-02-06: qty 2

## 2021-02-09 ENCOUNTER — Encounter: Payer: Self-pay | Admitting: Cardiology

## 2021-02-10 ENCOUNTER — Telehealth: Payer: Self-pay | Admitting: *Deleted

## 2021-02-10 DIAGNOSIS — Z01812 Encounter for preprocedural laboratory examination: Secondary | ICD-10-CM

## 2021-02-10 DIAGNOSIS — Z79899 Other long term (current) drug therapy: Secondary | ICD-10-CM

## 2021-02-10 DIAGNOSIS — I700 Atherosclerosis of aorta: Secondary | ICD-10-CM

## 2021-02-10 DIAGNOSIS — I7121 Aneurysm of the ascending aorta, without rupture: Secondary | ICD-10-CM

## 2021-02-10 MED ORDER — ROSUVASTATIN CALCIUM 10 MG PO TABS: 10.0000 mg | ORAL_TABLET | Freq: Every day | ORAL | 3 refills | Status: DC

## 2021-02-10 NOTE — Telephone Encounter (Signed)
Overall reassuring CT scan  Mild non flow limiting calcified plaque in front part of LAD artery  Borderline dilated ascending aorta (38.4 mm)  Let's have her start Crestor 10mg  PO once a day to help stablize calcified plaque.  Also in one year, let's repeat chest CTA with contrast to evaluate size and stability of aorta.   In 3 months let's check a lipid panel and ALT.  One year follow up.  , MD   Above are Dr Donato Schultz comments and orders regarding pt's coronary CT.  This RN responded to pt's MyChart advice request.  She is to call back regarding which pharmacy she would like Crestor 10 mg sent to and scheduling Lipid/ALT in 3 months.  Order will also need to be placed for chest CTA in 1 year to follow up borderline dilated ascending aorta.

## 2021-02-10 NOTE — Telephone Encounter (Signed)
Pt responded via MyChart.  Requests Crestor be sent to [facility].  Lab ordered and scheduled for 05/14/21.  Order for CTA chest and BMP in 1 year placed to follow up dilated aortic root.

## 2021-02-11 ENCOUNTER — Ambulatory Visit (HOSPITAL_COMMUNITY): Payer: Medicare PPO | Attending: Cardiology

## 2021-02-11 ENCOUNTER — Other Ambulatory Visit: Payer: Self-pay

## 2021-02-11 DIAGNOSIS — R55 Syncope and collapse: Secondary | ICD-10-CM | POA: Insufficient documentation

## 2021-02-11 DIAGNOSIS — I700 Atherosclerosis of aorta: Secondary | ICD-10-CM | POA: Diagnosis not present

## 2021-02-11 LAB — ECHOCARDIOGRAM COMPLETE
Area-P 1/2: 4.07 cm2
S' Lateral: 2.35 cm

## 2021-02-17 NOTE — Telephone Encounter (Signed)
Patient received prolia on 01-27-21. Summary of benefits scanned into Epic.   Encounter closed.

## 2021-03-19 DIAGNOSIS — M25512 Pain in left shoulder: Secondary | ICD-10-CM | POA: Diagnosis not present

## 2021-03-19 DIAGNOSIS — R0781 Pleurodynia: Secondary | ICD-10-CM | POA: Diagnosis not present

## 2021-03-25 ENCOUNTER — Ambulatory Visit: Payer: Medicare PPO | Admitting: Nurse Practitioner

## 2021-03-30 ENCOUNTER — Other Ambulatory Visit: Payer: Self-pay | Admitting: Family Medicine

## 2021-03-30 DIAGNOSIS — Z1231 Encounter for screening mammogram for malignant neoplasm of breast: Secondary | ICD-10-CM

## 2021-03-31 DIAGNOSIS — F4321 Adjustment disorder with depressed mood: Secondary | ICD-10-CM | POA: Diagnosis not present

## 2021-04-14 DIAGNOSIS — F332 Major depressive disorder, recurrent severe without psychotic features: Secondary | ICD-10-CM | POA: Diagnosis not present

## 2021-05-04 DIAGNOSIS — F4321 Adjustment disorder with depressed mood: Secondary | ICD-10-CM | POA: Diagnosis not present

## 2021-05-07 ENCOUNTER — Other Ambulatory Visit: Payer: Self-pay

## 2021-05-07 ENCOUNTER — Ambulatory Visit (INDEPENDENT_AMBULATORY_CARE_PROVIDER_SITE_OTHER): Payer: Medicare PPO | Admitting: Nurse Practitioner

## 2021-05-07 ENCOUNTER — Encounter: Payer: Self-pay | Admitting: Nurse Practitioner

## 2021-05-07 ENCOUNTER — Ambulatory Visit
Admission: RE | Admit: 2021-05-07 | Discharge: 2021-05-07 | Disposition: A | Discharge status: Home / Self Care | Payer: Medicare PPO | Source: Ambulatory Visit | Attending: Family Medicine | Admitting: Family Medicine

## 2021-05-07 ENCOUNTER — Other Ambulatory Visit: Payer: Self-pay | Admitting: Nurse Practitioner

## 2021-05-07 VITALS — BP 124/78 | Ht 61.0 in | Wt 118.0 lb

## 2021-05-07 DIAGNOSIS — Z9189 Other specified personal risk factors, not elsewhere classified: Secondary | ICD-10-CM

## 2021-05-07 DIAGNOSIS — Z780 Asymptomatic menopausal state: Secondary | ICD-10-CM | POA: Diagnosis not present

## 2021-05-07 DIAGNOSIS — Z01419 Encounter for gynecological examination (general) (routine) without abnormal findings: Secondary | ICD-10-CM

## 2021-05-07 DIAGNOSIS — B009 Herpesviral infection, unspecified: Secondary | ICD-10-CM

## 2021-05-07 DIAGNOSIS — L900 Lichen sclerosus et atrophicus: Secondary | ICD-10-CM | POA: Diagnosis not present

## 2021-05-07 DIAGNOSIS — Z1231 Encounter for screening mammogram for malignant neoplasm of breast: Secondary | ICD-10-CM | POA: Diagnosis not present

## 2021-05-07 DIAGNOSIS — M810 Age-related osteoporosis without current pathological fracture: Secondary | ICD-10-CM

## 2021-05-07 MED ORDER — VALACYCLOVIR HCL 500 MG PO TABS: 500.0000 mg | ORAL_TABLET | Freq: Every day | ORAL | 3 refills | Status: DC

## 2021-05-07 NOTE — Progress Notes (Signed)
° °  Miranda Pierce [birth year] [id number]   History:  71 y.o. [id number] presents for breast and pelvic exam. Postmenopausal - no HRT, no bleeding. Cryosurgery many years ago. Osteoporosis, on Prolia. History of LC, hypothyroidism.   Gynecologic History No LMP recorded. Patient is postmenopausal.   Contraception: post menopausal status Sexually active: Yes  Health maintenance Last Pap: 11/14/2014. Results were: Normal Last mammogram: 12/12/2019. Results were: Normal. Scheduled today at 1:00 Last colonoscopy: 2019. Results were: Normal Last Dexa:  07/02/2020 Results were: T-score -2.7 (improvement in all sites)  Past medical history, past surgical history, family history and social history were all reviewed and documented in the EPIC chart. Divorced. Boyfriend lives in [place]. 4 children, 1 grandchildren. 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   ROS:  A ROS was performed and pertinent positives and negatives are included.  Exam:  Vitals:   05/07/21 1103  BP: 124/78  Weight: 118 lb (53.5 kg)  Height: 5\' 1"  (1.549 m)    Body mass index is 22.3 kg/m.  General appearance:  Normal Thyroid:  Symmetrical, normal in size, without palpable masses or nodularity. Respiratory  Auscultation:  Clear without wheezing or rhonchi Cardiovascular  Auscultation:  Regular rate, without rubs, murmurs or gallops  Edema/varicosities:  Not grossly evident Abdominal  Soft,nontender, without masses, guarding or rebound.  Liver/spleen:  No organomegaly noted  Hernia:  None appreciated  Skin  Inspection:  Grossly normal   Breasts: Examined lying and sitting.   Right: Without masses, retractions, discharge or axillary adenopathy.   Left: Without masses, retractions, discharge or axillary adenopathy. Gentitourinary   Inguinal/mons:  Normal without inguinal adenopathy  External genitalia:  Normal  BUS/Urethra/Skene's glands:  Normal  Vagina:  Atrophic changes  Cervix:  Normal  Uterus:  Anteverted, normal in  size, shape and contour.  Midline and mobile  Adnexa/parametria:     Rt: Without masses or tenderness.   Lt: Without masses or tenderness.  Anus and perineum: Normal, non-bleeding hemorrhoid  Digital rectal exam: Normal sphincter tone without palpated masses or tenderness  Assessment/Plan:  71 y.o. 59 for breast and pelvic exam.   Well female exam with routine gynecological exam - Education provided on SBEs, importance of preventative screenings, current guidelines, high calcium diet, regular exercise, and multivitamin daily. Labs with PCP.   Postmenopausal - no HRT, no bleeding  Age-related osteoporosis without current pathological fracture - T-score -2.26 June 2020 (improvement in all sites). On Prolia, tolerating well. Continue Vitamin D + Calcium and regular exercise. Will repeat DXA next year.   Lichen sclerosus - Uses clobetasol as needed with good management. Does not need refill at this time.   HSV (herpes simplex virus) infection - Plan: valACYclovir (VALTREX) 500 MG tablet daily. Refill x 1 year provided.   Screening for cervical cancer - Normal Pap history. No longer screening per guidelines.   Screening for breast cancer - Normal mammogram history.  Continue annual screenings.  Normal breast exam today.  Screening for colon cancer - 2019 colonoscopy. Will repeat at GI's recommended interval.   Follow up in 2 years for breast and pelvic exam.  2020 [facility], 12:20 PM 05/07/2021

## 2021-05-12 DIAGNOSIS — F4321 Adjustment disorder with depressed mood: Secondary | ICD-10-CM | POA: Diagnosis not present

## 2021-05-14 ENCOUNTER — Other Ambulatory Visit: Payer: Medicare PPO

## 2021-05-25 DIAGNOSIS — F4321 Adjustment disorder with depressed mood: Secondary | ICD-10-CM | POA: Diagnosis not present

## 2021-05-28 ENCOUNTER — Other Ambulatory Visit: Payer: Self-pay

## 2021-05-28 ENCOUNTER — Other Ambulatory Visit: Payer: Medicare PPO

## 2021-05-28 DIAGNOSIS — I700 Atherosclerosis of aorta: Secondary | ICD-10-CM

## 2021-05-28 DIAGNOSIS — Z79899 Other long term (current) drug therapy: Secondary | ICD-10-CM | POA: Diagnosis not present

## 2021-05-28 LAB — ALT: ALT: 27 IU/L (ref 0–32)

## 2021-05-28 LAB — LIPID PANEL
Chol/HDL Ratio: 2.1 ratio (ref 0.0–4.4)
Cholesterol, Total: 167 mg/dL (ref 100–199)
HDL: 78 mg/dL (ref >39)
LDL Chol Calc (NIH): 76 mg/dL (ref 0–99)
Triglycerides: 68 mg/dL (ref 0–149)
VLDL Cholesterol Cal: 13 mg/dL (ref 5–40)

## 2021-05-29 ENCOUNTER — Other Ambulatory Visit: Payer: Self-pay | Admitting: *Deleted

## 2021-05-29 DIAGNOSIS — I700 Atherosclerosis of aorta: Secondary | ICD-10-CM

## 2021-05-29 DIAGNOSIS — Z79899 Other long term (current) drug therapy: Secondary | ICD-10-CM

## 2021-05-29 MED ORDER — ROSUVASTATIN CALCIUM 20 MG PO TABS: 20.0000 mg | ORAL_TABLET | Freq: Every day | ORAL | 3 refills | Status: DC

## 2021-05-29 NOTE — Progress Notes (Signed)
Let's increase Crestor to 20mg  PO QD to achieve LDL goal. ?Recheck lipids in 3 months ?Continue with diet and exercise. ? ?Orders placed for increased crestor and lab in 3 months.  ?

## 2021-06-02 DIAGNOSIS — F4321 Adjustment disorder with depressed mood: Secondary | ICD-10-CM | POA: Diagnosis not present

## 2021-06-18 DIAGNOSIS — F4321 Adjustment disorder with depressed mood: Secondary | ICD-10-CM | POA: Diagnosis not present

## 2021-06-18 DIAGNOSIS — R42 Dizziness and giddiness: Secondary | ICD-10-CM | POA: Diagnosis not present

## 2021-07-27 DIAGNOSIS — F4321 Adjustment disorder with depressed mood: Secondary | ICD-10-CM | POA: Diagnosis not present

## 2021-07-28 ENCOUNTER — Telehealth: Payer: Self-pay | Admitting: *Deleted

## 2021-07-28 NOTE — Telephone Encounter (Addendum)
Deductible n/a  OOP MAX n/a  Annual exam 05/07/2021  Calcium 9.5            Date 01/22/2021  Upcoming dental procedures NO  Prior Authorization needed yes approved auth # [id number] valid 04/24/2019-03-21-2022  [id number]    Pt estimated Cost $40   Appt 08/11/2021   Coverage Details: : Prolia is covered at 100%. The patient will be responsible for a $40 for administration.

## 2021-08-10 DIAGNOSIS — F4321 Adjustment disorder with depressed mood: Secondary | ICD-10-CM | POA: Diagnosis not present

## 2021-08-11 ENCOUNTER — Ambulatory Visit (INDEPENDENT_AMBULATORY_CARE_PROVIDER_SITE_OTHER): Payer: Medicare PPO

## 2021-08-11 DIAGNOSIS — M545 Low back pain, unspecified: Secondary | ICD-10-CM | POA: Diagnosis not present

## 2021-08-11 DIAGNOSIS — M25551 Pain in right hip: Secondary | ICD-10-CM | POA: Diagnosis not present

## 2021-08-11 DIAGNOSIS — M810 Age-related osteoporosis without current pathological fracture: Secondary | ICD-10-CM | POA: Diagnosis not present

## 2021-08-11 MED ORDER — DENOSUMAB 60 MG/ML ~~LOC~~ SOSY
60.0000 mg | PREFILLED_SYRINGE | Freq: Once | SUBCUTANEOUS | Status: AC
  Administered 2021-08-11: 60 mg via SUBCUTANEOUS

## 2021-08-12 DIAGNOSIS — Z79899 Other long term (current) drug therapy: Secondary | ICD-10-CM | POA: Diagnosis not present

## 2021-08-12 DIAGNOSIS — E7800 Pure hypercholesterolemia, unspecified: Secondary | ICD-10-CM | POA: Diagnosis not present

## 2021-08-12 DIAGNOSIS — E559 Vitamin D deficiency, unspecified: Secondary | ICD-10-CM | POA: Diagnosis not present

## 2021-08-26 DIAGNOSIS — F4321 Adjustment disorder with depressed mood: Secondary | ICD-10-CM | POA: Diagnosis not present

## 2021-08-31 DIAGNOSIS — E559 Vitamin D deficiency, unspecified: Secondary | ICD-10-CM | POA: Diagnosis not present

## 2021-08-31 DIAGNOSIS — Z0000 Encounter for general adult medical examination without abnormal findings: Secondary | ICD-10-CM | POA: Diagnosis not present

## 2021-08-31 DIAGNOSIS — E7800 Pure hypercholesterolemia, unspecified: Secondary | ICD-10-CM | POA: Diagnosis not present

## 2021-08-31 DIAGNOSIS — Z79899 Other long term (current) drug therapy: Secondary | ICD-10-CM | POA: Diagnosis not present

## 2021-08-31 DIAGNOSIS — M810 Age-related osteoporosis without current pathological fracture: Secondary | ICD-10-CM | POA: Diagnosis not present

## 2021-09-07 ENCOUNTER — Other Ambulatory Visit: Payer: Medicare PPO

## 2021-09-24 ENCOUNTER — Other Ambulatory Visit: Payer: Medicare PPO | Admitting: *Deleted

## 2021-09-24 DIAGNOSIS — I700 Atherosclerosis of aorta: Secondary | ICD-10-CM | POA: Diagnosis not present

## 2021-09-24 DIAGNOSIS — Z01812 Encounter for preprocedural laboratory examination: Secondary | ICD-10-CM

## 2021-09-24 DIAGNOSIS — M545 Low back pain, unspecified: Secondary | ICD-10-CM | POA: Diagnosis not present

## 2021-09-24 DIAGNOSIS — I7121 Aneurysm of the ascending aorta, without rupture: Secondary | ICD-10-CM

## 2021-09-24 DIAGNOSIS — Z79899 Other long term (current) drug therapy: Secondary | ICD-10-CM

## 2021-09-24 LAB — LIPID PANEL
Chol/HDL Ratio: 1.8 ratio (ref 0.0–4.4)
Cholesterol, Total: 135 mg/dL (ref 100–199)
HDL: 73 mg/dL (ref >39)
LDL Chol Calc (NIH): 51 mg/dL (ref 0–99)
Triglycerides: 47 mg/dL (ref 0–149)
VLDL Cholesterol Cal: 11 mg/dL (ref 5–40)

## 2021-09-24 LAB — BASIC METABOLIC PANEL
BUN/Creatinine Ratio: 23 (ref 12–28)
BUN: 15 mg/dL (ref 8–27)
CO2: 25 mmol/L (ref 20–29)
Calcium: 9.1 mg/dL (ref 8.7–10.3)
Chloride: 103 mmol/L (ref 96–106)
Creatinine, Ser: 0.66 mg/dL (ref 0.57–1.00)
Glucose: 82 mg/dL (ref 70–99)
Potassium: 5.1 mmol/L (ref 3.5–5.2)
Sodium: 141 mmol/L (ref 134–144)
eGFR: 93 mL/min/{1.73_m2} (ref >59)

## 2021-09-24 LAB — ALT: ALT: 24 IU/L (ref 0–32)

## 2021-09-28 DIAGNOSIS — M545 Low back pain, unspecified: Secondary | ICD-10-CM | POA: Diagnosis not present

## 2021-09-29 DIAGNOSIS — F332 Major depressive disorder, recurrent severe without psychotic features: Secondary | ICD-10-CM | POA: Diagnosis not present

## 2021-10-05 DIAGNOSIS — F4321 Adjustment disorder with depressed mood: Secondary | ICD-10-CM | POA: Diagnosis not present

## 2021-11-04 DIAGNOSIS — F4321 Adjustment disorder with depressed mood: Secondary | ICD-10-CM | POA: Diagnosis not present

## 2021-11-11 DIAGNOSIS — F4321 Adjustment disorder with depressed mood: Secondary | ICD-10-CM | POA: Diagnosis not present

## 2021-11-18 DIAGNOSIS — F4321 Adjustment disorder with depressed mood: Secondary | ICD-10-CM | POA: Diagnosis not present

## 2021-11-30 DIAGNOSIS — F4321 Adjustment disorder with depressed mood: Secondary | ICD-10-CM | POA: Diagnosis not present

## 2021-12-28 ENCOUNTER — Encounter: Payer: Self-pay | Admitting: Obstetrics & Gynecology

## 2022-01-04 DIAGNOSIS — F4321 Adjustment disorder with depressed mood: Secondary | ICD-10-CM | POA: Diagnosis not present

## 2022-01-29 DIAGNOSIS — I2510 Atherosclerotic heart disease of native coronary artery without angina pectoris: Secondary | ICD-10-CM | POA: Diagnosis not present

## 2022-01-29 DIAGNOSIS — M79641 Pain in right hand: Secondary | ICD-10-CM | POA: Diagnosis not present

## 2022-01-29 DIAGNOSIS — M810 Age-related osteoporosis without current pathological fracture: Secondary | ICD-10-CM | POA: Diagnosis not present

## 2022-01-29 DIAGNOSIS — M25541 Pain in joints of right hand: Secondary | ICD-10-CM | POA: Diagnosis not present

## 2022-01-29 DIAGNOSIS — M25531 Pain in right wrist: Secondary | ICD-10-CM | POA: Diagnosis not present

## 2022-02-09 ENCOUNTER — Inpatient Hospital Stay: Admission: RE | Admit: 2022-02-09 | Payer: Medicare PPO | Source: Ambulatory Visit

## 2022-02-10 DIAGNOSIS — F4321 Adjustment disorder with depressed mood: Secondary | ICD-10-CM | POA: Diagnosis not present

## 2022-02-23 DIAGNOSIS — F4321 Adjustment disorder with depressed mood: Secondary | ICD-10-CM | POA: Diagnosis not present

## 2022-03-02 DIAGNOSIS — F332 Major depressive disorder, recurrent severe without psychotic features: Secondary | ICD-10-CM | POA: Diagnosis not present

## 2022-03-12 ENCOUNTER — Telehealth: Payer: Self-pay | Admitting: *Deleted

## 2022-03-12 NOTE — Telephone Encounter (Signed)
Deductible n/a  OOP MAX $4000 ($127.19 met)  Annual exam 05/07/2021  Calcium   9.8          Date 01/29/2022  Upcoming dental procedures   Prior Authorization needed Prior Authorization needed yes approved auth # [id number] valid 04/24/2019-03-21-2022  [id number]  Pt estimated Cost $40   Pt has moved to [place]. Pt will call me back needs to arrange other options    Coverage Details:0% one dose, $40 admin fee

## 2022-03-26 DIAGNOSIS — F4321 Adjustment disorder with depressed mood: Secondary | ICD-10-CM | POA: Diagnosis not present

## 2022-04-01 DIAGNOSIS — F4321 Adjustment disorder with depressed mood: Secondary | ICD-10-CM | POA: Diagnosis not present

## 2022-04-08 DIAGNOSIS — F4321 Adjustment disorder with depressed mood: Secondary | ICD-10-CM | POA: Diagnosis not present

## 2022-04-12 DIAGNOSIS — F4321 Adjustment disorder with depressed mood: Secondary | ICD-10-CM | POA: Diagnosis not present

## 2022-04-13 ENCOUNTER — Other Ambulatory Visit: Payer: Self-pay | Admitting: Cardiology

## 2022-04-20 ENCOUNTER — Telehealth: Payer: Self-pay | Admitting: *Deleted

## 2022-04-20 DIAGNOSIS — M25551 Pain in right hip: Secondary | ICD-10-CM | POA: Diagnosis not present

## 2022-04-20 NOTE — Telephone Encounter (Signed)
Prolia insurance verification has been sent awaiting Summary of benefits  

## 2022-05-03 NOTE — Telephone Encounter (Addendum)
Deductible n/a  OOP MAX $4000 ($0 met)  Annual exam Needs med check appt/lm for pt to call me back to discuss  Calcium  9.8           Date 01/29/2022  Upcoming dental procedures   Is Prior Authorization required yes approved auth # [id number]  04/24/2019-03/22/2023   05/17/2022 telephone call: pt will call me back to make med check and Prolia same day appt pt is in [place]. Encounter closed/ will addend once Prolia is scheduled    Pt estimated Cost $0     Coverage Details:$0 one dose, $0 admin fee

## 2022-05-12 DIAGNOSIS — F4321 Adjustment disorder with depressed mood: Secondary | ICD-10-CM | POA: Diagnosis not present

## 2022-05-21 IMAGING — MG DIGITAL SCREENING BILAT W/ TOMO W/ CAD
8 series · 9 of 24 positions shown · non-contrast
Comparison: Previous exam(s).

CLINICAL DATA: Screening.

EXAM:
DIGITAL SCREENING BILATERAL MAMMOGRAM WITH TOMO AND CAD

[L MLO synth-2D]
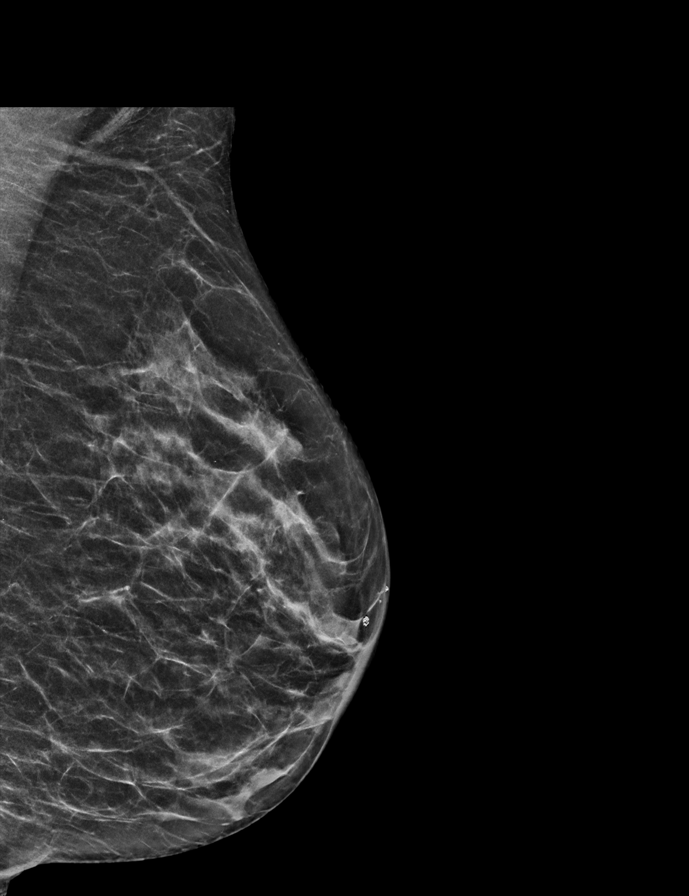

[R MLO synth-2D]
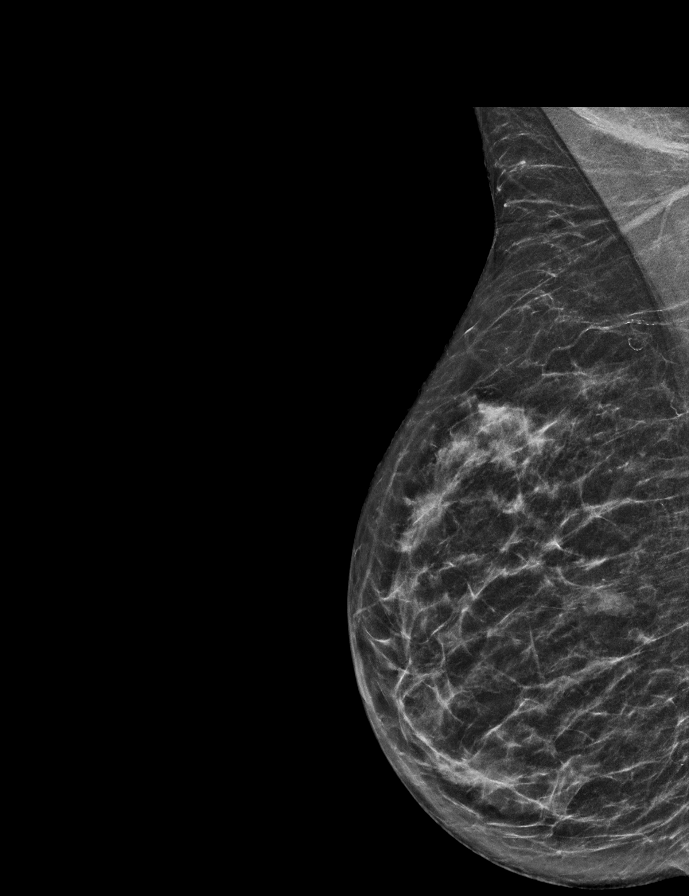

[L CC synth-2D]
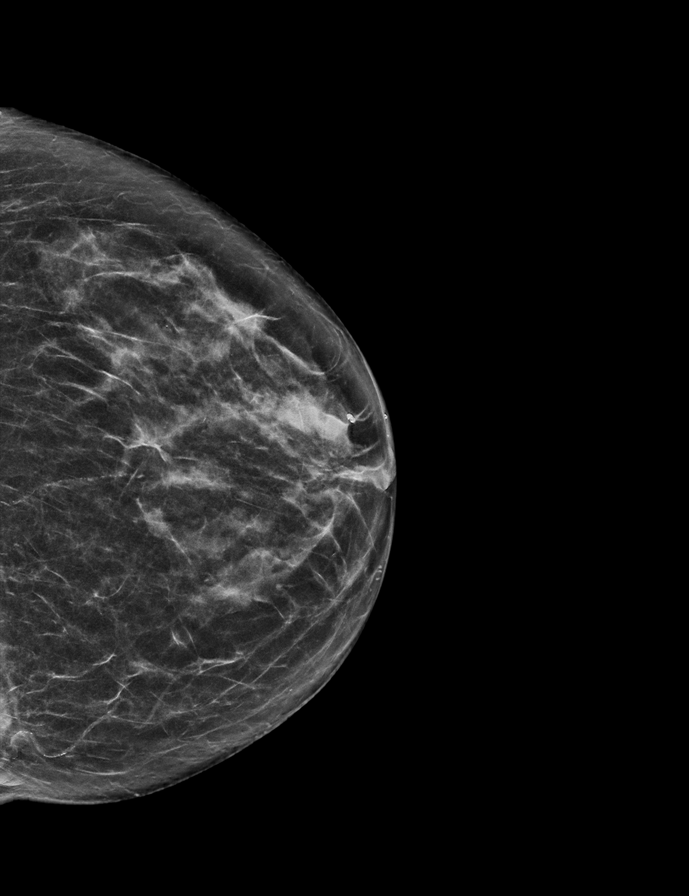

[R CC synth-2D]
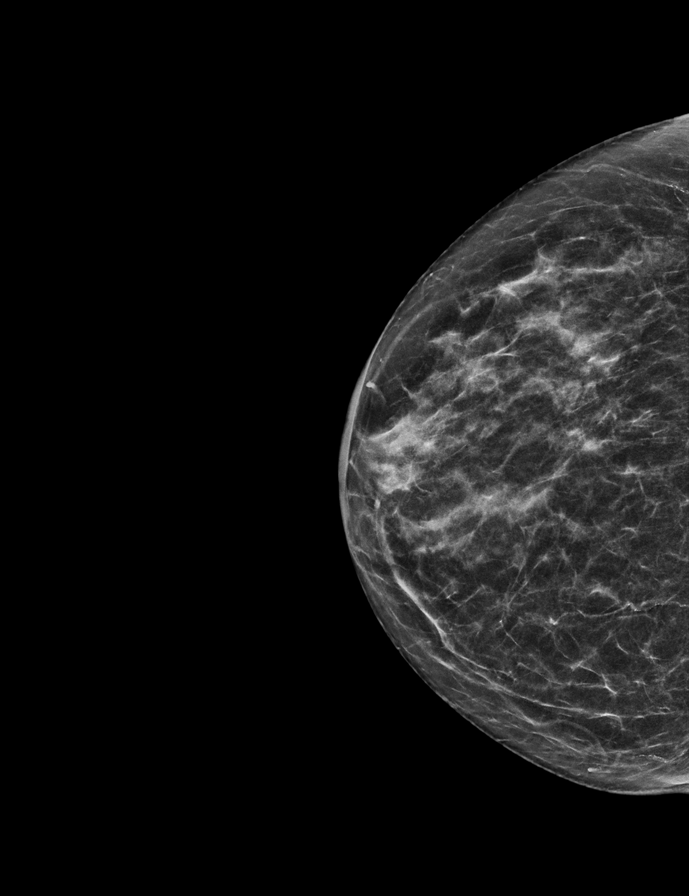

[L CC tomo · 2 of 54 frames shown]
[frame 18/54]
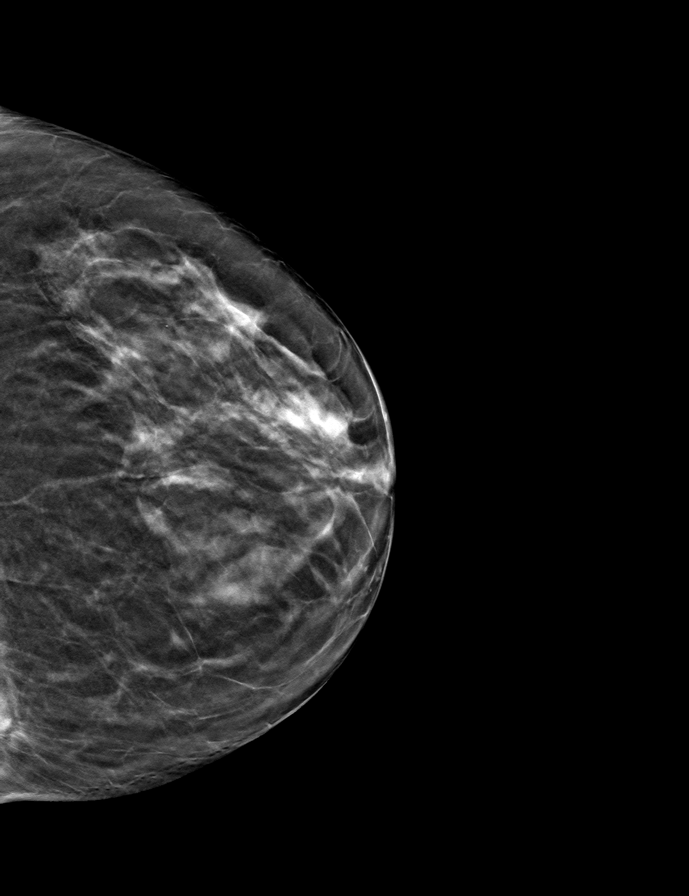
[frame 27/54]
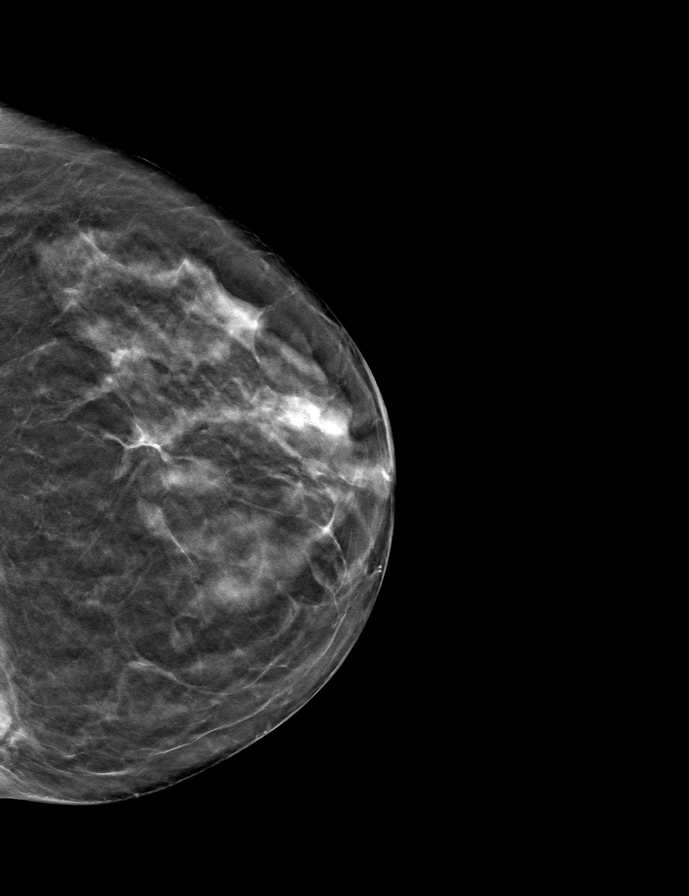

[R MLO tomo · tomo slice 26/51.0]
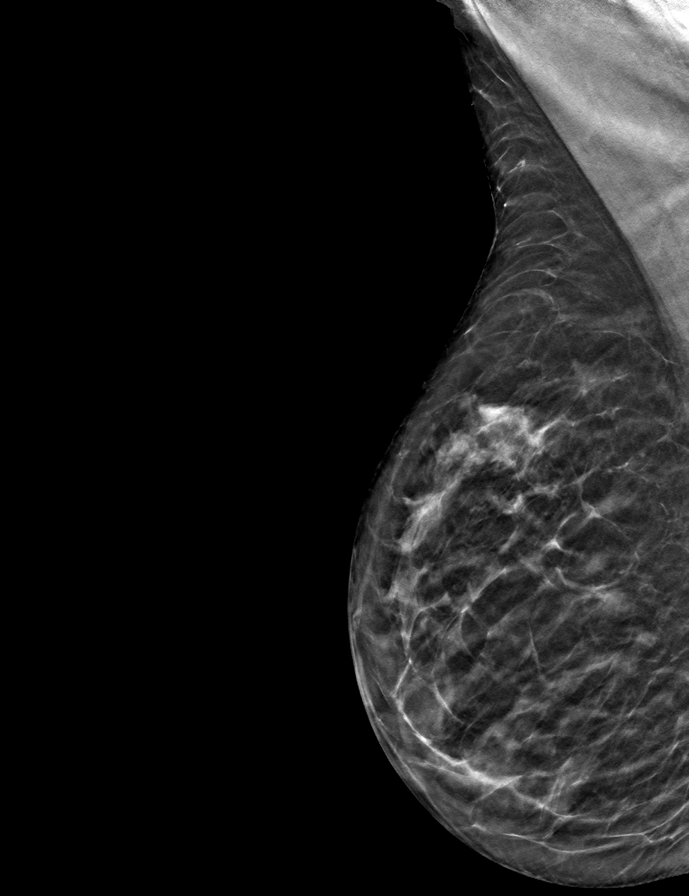

[L MLO tomo · tomo slice 25/50.0]
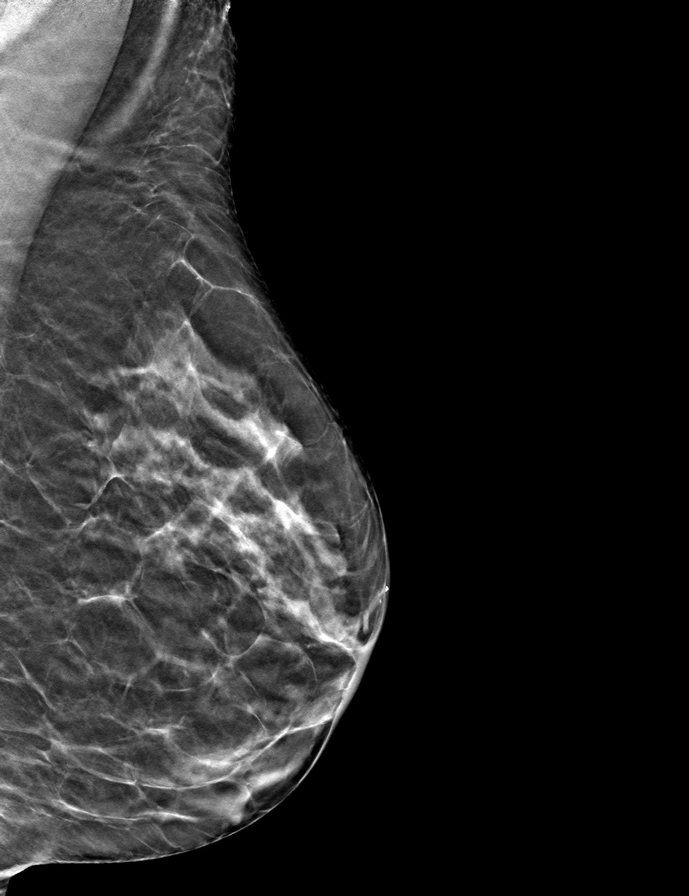

[R CC tomo · tomo slice 25/49.0]
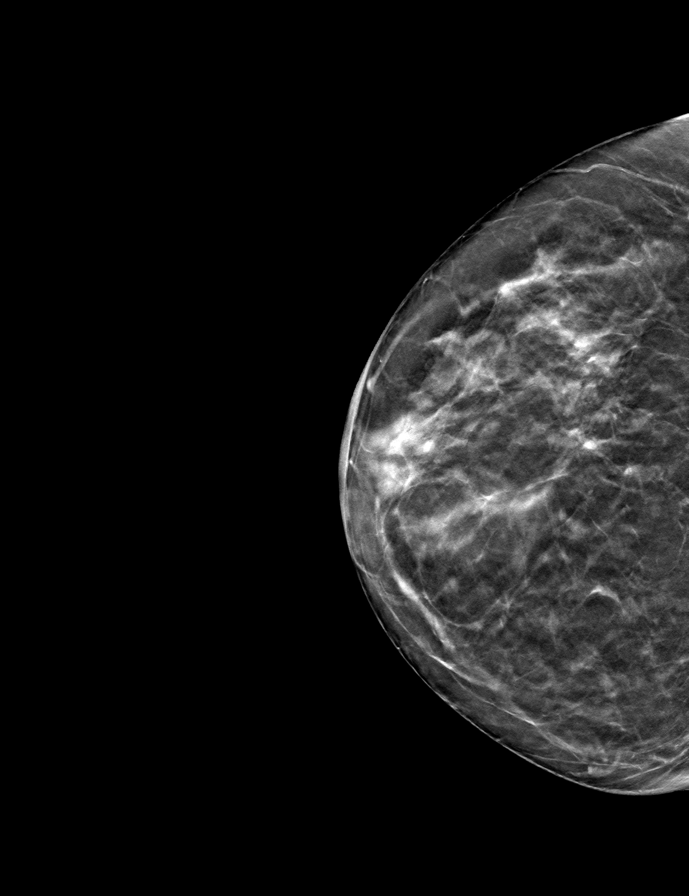

[9 of 24 positions shown; findings below may reference images not displayed]

ACR Breast Density Category c: The breast tissue is heterogeneously
dense, which may obscure small masses.
FINDINGS: There are no findings suspicious for malignancy. Images were
processed with CAD.
IMPRESSION: No mammographic evidence of malignancy. A result letter of this
screening mammogram will be mailed directly to the patient.

RECOMMENDATION:
Screening mammogram in one year. (Code:[id number])

BI-RADS CATEGORY  1: Negative.

## 2022-05-25 DIAGNOSIS — F4321 Adjustment disorder with depressed mood: Secondary | ICD-10-CM | POA: Diagnosis not present

## 2022-05-27 ENCOUNTER — Other Ambulatory Visit: Payer: Self-pay | Admitting: *Deleted

## 2022-05-27 DIAGNOSIS — B009 Herpesviral infection, unspecified: Secondary | ICD-10-CM

## 2022-05-27 MED ORDER — VALACYCLOVIR HCL 500 MG PO TABS: 500.0000 mg | ORAL_TABLET | Freq: Every day | ORAL | 0 refills | Status: DC

## 2022-05-27 NOTE — Telephone Encounter (Signed)
Medication refill request: Valtrex Last AEX:  05-07-21 TW Next AEX: needs to schedule Last MMG (if hormonal medication request): n/a Refill authorized: Please advise.   Patient left voicemail on Triage line requesting refill of valtrex. Aware overdue for aex, but states she now lives in [facility], so working on coordinating a day to come for appointment. [facility] [address] [facility].   Medication pended for #30, 0RF. Please refill if appropriate.

## 2022-05-31 DIAGNOSIS — Z13220 Encounter for screening for lipoid disorders: Secondary | ICD-10-CM | POA: Diagnosis not present

## 2022-05-31 DIAGNOSIS — R102 Pelvic and perineal pain: Secondary | ICD-10-CM | POA: Diagnosis not present

## 2022-05-31 DIAGNOSIS — Z0000 Encounter for general adult medical examination without abnormal findings: Secondary | ICD-10-CM | POA: Diagnosis not present

## 2022-05-31 DIAGNOSIS — R202 Paresthesia of skin: Secondary | ICD-10-CM | POA: Diagnosis not present

## 2022-05-31 DIAGNOSIS — Z6833 Body mass index (BMI) 33.0-33.9, adult: Secondary | ICD-10-CM | POA: Diagnosis not present

## 2022-06-08 DIAGNOSIS — A6000 Herpesviral infection of urogenital system, unspecified: Secondary | ICD-10-CM | POA: Diagnosis not present

## 2022-06-08 DIAGNOSIS — F4381 Prolonged grief disorder: Secondary | ICD-10-CM | POA: Diagnosis not present

## 2022-06-08 DIAGNOSIS — F3181 Bipolar II disorder: Secondary | ICD-10-CM | POA: Diagnosis not present

## 2022-06-08 DIAGNOSIS — M816 Localized osteoporosis [Lequesne]: Secondary | ICD-10-CM | POA: Diagnosis not present

## 2022-06-08 DIAGNOSIS — I253 Aneurysm of heart: Secondary | ICD-10-CM | POA: Diagnosis not present

## 2022-06-08 DIAGNOSIS — E782 Mixed hyperlipidemia: Secondary | ICD-10-CM | POA: Diagnosis not present

## 2022-06-08 DIAGNOSIS — F902 Attention-deficit hyperactivity disorder, combined type: Secondary | ICD-10-CM | POA: Diagnosis not present

## 2022-06-11 DIAGNOSIS — Z0000 Encounter for general adult medical examination without abnormal findings: Secondary | ICD-10-CM | POA: Diagnosis not present

## 2022-06-14 ENCOUNTER — Telehealth: Payer: Self-pay | Admitting: *Deleted

## 2022-06-14 NOTE — Telephone Encounter (Signed)
-----   Message from Princess Bruins, MD sent at 06/11/2022  8:08 AM EDT ----- Regarding: RE: scheduling BMD, med check and prolia on same day Yes Dr Carlean Jews ----- Message ----- From: Burnice Logan, RN Sent: 06/10/2022  12:37 PM EDT To: Princess Bruins, MD; Matthew Folks, RN; # Subject: FW: scheduling BMD, med check and prolia on #  Dr. Dellis Filbert,   This patient is traveling from out of town. Would it be possible for her to have BMD in the morning and return for an appt later in the day with Tiffany to review results and receive Prolia if appropriate? I want to be sure this will allow you time to review the results.   Sharee Pimple ----- Message ----- From: Matthew Folks, RN Sent: 06/10/2022  12:16 PM EDT To: Burnice Logan, RN Subject: scheduling BMD, med check and prolia on same#  [facility],   This is the patient who lives in [facility] that is hoping to schedule BMD, med check and prolia injection all on the same day. I just spoke to her and told her you would be in touch (not today) once the logistics of reading the BMD and scheduling were worked out. She was okay with this plan.   Thanks, Raquel Sarna

## 2022-06-14 NOTE — Telephone Encounter (Signed)
Spoke with patient.   BMD scheduled for 07/20/22 at 0900.   Consult with Jonelle Sidle, NP on 07/20/22 at 3:00pm.   Advised patient I will forward back to [facility] to review Prolia benefits prior to visits. Patient states insurance will need BMD results prior to receiving Prolia. Advised our office can fax; however, I am unsure of turn around time for confirmation from insurance company. Patient states if unable to receive Prolia same day she will check with her PCP in [facility]. Patient appreciative of assistance.   Rounding to Raquel Sarna to review benefits and f/u.  Staff message to [employer].

## 2022-06-15 NOTE — Telephone Encounter (Signed)
Call to [facility], spoke with Representative, Lauren who states PA on file for Prolia good through 03-22-23. Reference number for call: [id number].

## 2022-06-21 ENCOUNTER — Other Ambulatory Visit: Payer: Self-pay

## 2022-06-21 DIAGNOSIS — B009 Herpesviral infection, unspecified: Secondary | ICD-10-CM

## 2022-06-21 MED ORDER — VALACYCLOVIR HCL 500 MG PO TABS: 500.0000 mg | ORAL_TABLET | Freq: Every day | ORAL | 0 refills | Status: DC

## 2022-06-21 NOTE — Telephone Encounter (Signed)
Last AEX 05/07/21 Scheduled AEX 07/20/22

## 2022-06-29 DIAGNOSIS — R102 Pelvic and perineal pain: Secondary | ICD-10-CM | POA: Diagnosis not present

## 2022-07-01 DIAGNOSIS — Z6833 Body mass index (BMI) 33.0-33.9, adult: Secondary | ICD-10-CM | POA: Diagnosis not present

## 2022-07-01 DIAGNOSIS — R202 Paresthesia of skin: Secondary | ICD-10-CM | POA: Diagnosis not present

## 2022-07-01 DIAGNOSIS — M79641 Pain in right hand: Secondary | ICD-10-CM | POA: Diagnosis not present

## 2022-07-01 DIAGNOSIS — M25551 Pain in right hip: Secondary | ICD-10-CM | POA: Diagnosis not present

## 2022-07-06 DIAGNOSIS — E782 Mixed hyperlipidemia: Secondary | ICD-10-CM | POA: Diagnosis not present

## 2022-07-06 DIAGNOSIS — F4381 Prolonged grief disorder: Secondary | ICD-10-CM | POA: Diagnosis not present

## 2022-07-06 DIAGNOSIS — M816 Localized osteoporosis [Lequesne]: Secondary | ICD-10-CM | POA: Diagnosis not present

## 2022-07-06 DIAGNOSIS — F902 Attention-deficit hyperactivity disorder, combined type: Secondary | ICD-10-CM | POA: Diagnosis not present

## 2022-07-06 DIAGNOSIS — F3181 Bipolar II disorder: Secondary | ICD-10-CM | POA: Diagnosis not present

## 2022-07-06 DIAGNOSIS — I253 Aneurysm of heart: Secondary | ICD-10-CM | POA: Diagnosis not present

## 2022-07-06 DIAGNOSIS — A6000 Herpesviral infection of urogenital system, unspecified: Secondary | ICD-10-CM | POA: Diagnosis not present

## 2022-07-16 DIAGNOSIS — F4321 Adjustment disorder with depressed mood: Secondary | ICD-10-CM | POA: Diagnosis not present

## 2022-07-19 ENCOUNTER — Other Ambulatory Visit: Payer: Self-pay

## 2022-07-19 DIAGNOSIS — M818 Other osteoporosis without current pathological fracture: Secondary | ICD-10-CM

## 2022-07-20 ENCOUNTER — Ambulatory Visit (INDEPENDENT_AMBULATORY_CARE_PROVIDER_SITE_OTHER): Payer: Medicare PPO

## 2022-07-20 ENCOUNTER — Other Ambulatory Visit: Payer: Self-pay | Admitting: Nurse Practitioner

## 2022-07-20 ENCOUNTER — Ambulatory Visit: Payer: Medicare PPO | Admitting: Nurse Practitioner

## 2022-07-20 VITALS — BP 110/72

## 2022-07-20 DIAGNOSIS — Z780 Asymptomatic menopausal state: Secondary | ICD-10-CM

## 2022-07-20 DIAGNOSIS — B009 Herpesviral infection, unspecified: Secondary | ICD-10-CM | POA: Diagnosis not present

## 2022-07-20 DIAGNOSIS — L900 Lichen sclerosus et atrophicus: Secondary | ICD-10-CM | POA: Diagnosis not present

## 2022-07-20 DIAGNOSIS — M810 Age-related osteoporosis without current pathological fracture: Secondary | ICD-10-CM

## 2022-07-20 DIAGNOSIS — Z13820 Encounter for screening for osteoporosis: Secondary | ICD-10-CM | POA: Diagnosis not present

## 2022-07-20 DIAGNOSIS — M818 Other osteoporosis without current pathological fracture: Secondary | ICD-10-CM

## 2022-07-20 MED ORDER — VALACYCLOVIR HCL 500 MG PO TABS: 500.0000 mg | ORAL_TABLET | Freq: Every day | ORAL | 3 refills | Status: AC

## 2022-07-20 MED ORDER — DENOSUMAB 60 MG/ML ~~LOC~~ SOSY
60.0000 mg | PREFILLED_SYRINGE | Freq: Once | SUBCUTANEOUS | Status: AC
  Administered 2022-07-20: 60 mg via SUBCUTANEOUS

## 2022-07-20 NOTE — Progress Notes (Signed)
   Acute Office Visit  Subjective:    Patient ID: Miranda Pierce, female    DOB: December 22, 1950, 72 y.o.   MRN: [id number]   HPI 72 y.o. presents today medication management and to discuss DXA results from today 07/20/22. T-score -2.8 (-2.7 in 2022). Started Prolia 03/2018 for management of osteoporosis. Last Prolia dose May 2023 due to patient moving last fall. On Fosamax previously. Clobetasol used as needed for lichen sclerosus, rare use. HSV, takes Valtrex daily. Has been struggling emotionally, seeing therapist.   No LMP recorded. Patient is postmenopausal.    Review of Systems  Constitutional: Negative.   Psychiatric/Behavioral:  Positive for dysphoric mood.        Objective:    Physical Exam Constitutional:      Appearance: Normal appearance.  Psychiatric:        Attention and Perception: Attention normal.        Speech: Speech normal.     BP 110/72 (BP Location: Left Arm, Patient Position: Sitting, Cuff Size: Normal)  Wt Readings from Last 3 Encounters:  05/07/21 118 lb (53.5 kg)  01/27/21 116 lb (52.6 kg)  01/22/21 117 lb (53.1 kg)       Assessment & Plan:   Problem List Items Addressed This Visit       Other   Lichen sclerosus   Other Visit Diagnoses     Age-related osteoporosis without current pathological fracture    -  Primary   Relevant Medications   denosumab (PROLIA) injection 60 mg (Completed)   HSV infection       Relevant Medications   valACYclovir (VALTREX) 500 MG tablet      Plan: DXA results reviewed with patient. Stable but slight decrease in bone mass, although she has not had Prolia injection in a year. Prolia injection provided today. 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 Valtrex refill provided. Does not need refill on Clobetasol at this time. Return in 1 year for breast and pelvic exam.     Olivia Mackie DNP, 4:03 PM 07/20/2022

## 2022-07-21 DIAGNOSIS — G5601 Carpal tunnel syndrome, right upper limb: Secondary | ICD-10-CM | POA: Diagnosis not present

## 2022-07-21 DIAGNOSIS — M79641 Pain in right hand: Secondary | ICD-10-CM | POA: Diagnosis not present

## 2022-07-22 DIAGNOSIS — M25551 Pain in right hip: Secondary | ICD-10-CM | POA: Diagnosis not present

## 2022-07-31 ENCOUNTER — Other Ambulatory Visit: Payer: Self-pay | Admitting: Cardiology

## 2022-08-04 ENCOUNTER — Other Ambulatory Visit: Payer: Self-pay | Admitting: Cardiology

## 2022-08-04 DIAGNOSIS — F4321 Adjustment disorder with depressed mood: Secondary | ICD-10-CM | POA: Diagnosis not present

## 2022-08-05 DIAGNOSIS — A6000 Herpesviral infection of urogenital system, unspecified: Secondary | ICD-10-CM | POA: Diagnosis not present

## 2022-08-05 DIAGNOSIS — F4381 Prolonged grief disorder: Secondary | ICD-10-CM | POA: Diagnosis not present

## 2022-08-05 DIAGNOSIS — M816 Localized osteoporosis [Lequesne]: Secondary | ICD-10-CM | POA: Diagnosis not present

## 2022-08-05 DIAGNOSIS — F902 Attention-deficit hyperactivity disorder, combined type: Secondary | ICD-10-CM | POA: Diagnosis not present

## 2022-08-05 DIAGNOSIS — I253 Aneurysm of heart: Secondary | ICD-10-CM | POA: Diagnosis not present

## 2022-08-05 DIAGNOSIS — F3181 Bipolar II disorder: Secondary | ICD-10-CM | POA: Diagnosis not present

## 2022-08-05 DIAGNOSIS — E782 Mixed hyperlipidemia: Secondary | ICD-10-CM | POA: Diagnosis not present

## 2022-08-11 DIAGNOSIS — G5601 Carpal tunnel syndrome, right upper limb: Secondary | ICD-10-CM | POA: Diagnosis not present

## 2022-08-12 NOTE — Telephone Encounter (Signed)
Per review of EPIC, patient received Prolia in office on 07/20/22.   Irving Burton -ok to close encounter?

## 2022-08-17 NOTE — Telephone Encounter (Signed)
Encounter closed

## 2022-08-20 ENCOUNTER — Other Ambulatory Visit: Payer: Self-pay | Admitting: Cardiology

## 2022-08-20 MED ORDER — ROSUVASTATIN CALCIUM 20 MG PO TABS: 20.0000 mg | ORAL_TABLET | Freq: Every day | ORAL | 0 refills | Status: DC

## 2022-08-20 NOTE — Telephone Encounter (Signed)
Pt is requesting a refill on rosuvastatin. Pt has not been seen since 2022 with Dr. Anne Fu. Pt is requesting a referral to be seen by another Cardiologist where she is staying. Would Dr. Anne Fu like to refill this medication? Please address

## 2022-08-20 NOTE — Telephone Encounter (Signed)
Patient states she moved about 2 hours away and she is requesting a referral for a new cardiologist in the [place], [place] area. She would like a 30 day supply of Rosuvastatin to last her until she can be seen by a new cardiologist.   *STAT* If patient is at the pharmacy, call can be transferred to refill team.   1. Which medications need to be refilled? (please list name of each medication and dose if known) rosuvastatin (CRESTOR) 20 MG tablet  2. Which pharmacy/location (including street and city if local pharmacy) is medication to be sent to? [facility] [address], [place], [place] [address]  3. Do they need a 30 day or 90 day supply?  30 day supply

## 2022-08-26 ENCOUNTER — Telehealth: Payer: Self-pay | Admitting: Cardiology

## 2022-08-26 DIAGNOSIS — Z79899 Other long term (current) drug therapy: Secondary | ICD-10-CM

## 2022-08-26 NOTE — Telephone Encounter (Signed)
  Pt is calling to f/u her request to get a referral for new cardiologist in the [place], [place].

## 2022-08-31 NOTE — Telephone Encounter (Signed)
Spoke to the patient, she has relocated to [place], [place] and will like referral to cardiologist near her. AMB referral sent to [facility] cardiology. Pt voiced understanding.

## 2022-09-01 DIAGNOSIS — F4321 Adjustment disorder with depressed mood: Secondary | ICD-10-CM | POA: Diagnosis not present

## 2022-09-09 DIAGNOSIS — Z79899 Other long term (current) drug therapy: Secondary | ICD-10-CM | POA: Diagnosis not present

## 2022-09-09 DIAGNOSIS — E559 Vitamin D deficiency, unspecified: Secondary | ICD-10-CM | POA: Diagnosis not present

## 2022-09-09 DIAGNOSIS — E7800 Pure hypercholesterolemia, unspecified: Secondary | ICD-10-CM | POA: Diagnosis not present

## 2022-09-13 DIAGNOSIS — M5459 Other low back pain: Secondary | ICD-10-CM | POA: Diagnosis not present

## 2022-09-13 DIAGNOSIS — M47816 Spondylosis without myelopathy or radiculopathy, lumbar region: Secondary | ICD-10-CM | POA: Diagnosis not present

## 2022-09-17 DIAGNOSIS — M25531 Pain in right wrist: Secondary | ICD-10-CM | POA: Diagnosis not present

## 2022-09-22 DIAGNOSIS — F4321 Adjustment disorder with depressed mood: Secondary | ICD-10-CM | POA: Diagnosis not present

## 2022-10-04 DIAGNOSIS — E785 Hyperlipidemia, unspecified: Secondary | ICD-10-CM | POA: Diagnosis not present

## 2022-10-04 DIAGNOSIS — F988 Other specified behavioral and emotional disorders with onset usually occurring in childhood and adolescence: Secondary | ICD-10-CM | POA: Diagnosis not present

## 2022-10-04 DIAGNOSIS — A6009 Herpesviral infection of other urogenital tract: Secondary | ICD-10-CM | POA: Diagnosis not present

## 2022-10-04 DIAGNOSIS — M810 Age-related osteoporosis without current pathological fracture: Secondary | ICD-10-CM | POA: Diagnosis not present

## 2022-10-04 DIAGNOSIS — I700 Atherosclerosis of aorta: Secondary | ICD-10-CM | POA: Diagnosis not present

## 2022-10-04 DIAGNOSIS — Z6835 Body mass index (BMI) 35.0-35.9, adult: Secondary | ICD-10-CM | POA: Diagnosis not present

## 2022-10-04 DIAGNOSIS — F39 Unspecified mood [affective] disorder: Secondary | ICD-10-CM | POA: Diagnosis not present

## 2022-10-08 ENCOUNTER — Telehealth: Payer: Self-pay | Admitting: Cardiology

## 2022-10-08 NOTE — Telephone Encounter (Signed)
Pt c/o medication issue:  1. Name of Medication:   rosuvastatin (CRESTOR) 20 MG tablet    2. How are you currently taking this medication (dosage and times per day)?   3. Are you having a reaction (difficulty breathing--STAT)?   4. What is your medication issue? Patient states that she has appt scheduled with new doctor end of September and would like to know if she can have refill of this medication until that time. Please advise.

## 2022-10-08 NOTE — Telephone Encounter (Signed)
Called patient back about her message. Informed her that a message would be sent to Dr. Anne Fu to see if she could get a refill until September. Patient has not been seen since 2022, her last LDL was 51 (09/24/21), and Ca score was 13.4 (02/10/21). Patient has moved to [place] and has office visit with new cardiologist in September. Will se if Dr. Anne Fu is okay with refill.

## 2022-10-11 MED ORDER — ROSUVASTATIN CALCIUM 20 MG PO TABS: 20.0000 mg | ORAL_TABLET | Freq: Every day | ORAL | 0 refills | Status: AC

## 2022-10-11 NOTE — Telephone Encounter (Signed)
Called patient back to let her know Crestor was okay to refills and it has been sent to her pharmacy.

## 2022-10-19 DIAGNOSIS — M47816 Spondylosis without myelopathy or radiculopathy, lumbar region: Secondary | ICD-10-CM | POA: Diagnosis not present

## 2022-10-27 DIAGNOSIS — F4321 Adjustment disorder with depressed mood: Secondary | ICD-10-CM | POA: Diagnosis not present

## 2022-11-05 DIAGNOSIS — F4321 Adjustment disorder with depressed mood: Secondary | ICD-10-CM | POA: Diagnosis not present

## 2022-11-16 DIAGNOSIS — M47816 Spondylosis without myelopathy or radiculopathy, lumbar region: Secondary | ICD-10-CM | POA: Diagnosis not present

## 2022-12-04 DIAGNOSIS — F988 Other specified behavioral and emotional disorders with onset usually occurring in childhood and adolescence: Secondary | ICD-10-CM | POA: Diagnosis not present

## 2022-12-06 DIAGNOSIS — I2510 Atherosclerotic heart disease of native coronary artery without angina pectoris: Secondary | ICD-10-CM | POA: Diagnosis not present

## 2022-12-06 DIAGNOSIS — E782 Mixed hyperlipidemia: Secondary | ICD-10-CM | POA: Diagnosis not present

## 2022-12-06 DIAGNOSIS — I7789 Other specified disorders of arteries and arterioles: Secondary | ICD-10-CM | POA: Diagnosis not present

## 2022-12-09 ENCOUNTER — Telehealth: Payer: Self-pay | Admitting: *Deleted

## 2022-12-09 DIAGNOSIS — F4321 Adjustment disorder with depressed mood: Secondary | ICD-10-CM | POA: Diagnosis not present

## 2022-12-09 NOTE — Telephone Encounter (Signed)
Insurance information submitted to [employer] portal. Will await summary of benefits for prolia.    

## 2022-12-16 DIAGNOSIS — M47816 Spondylosis without myelopathy or radiculopathy, lumbar region: Secondary | ICD-10-CM | POA: Diagnosis not present

## 2022-12-21 DIAGNOSIS — I7121 Aneurysm of the ascending aorta, without rupture: Secondary | ICD-10-CM | POA: Diagnosis not present

## 2022-12-21 NOTE — Telephone Encounter (Signed)
Call to patient. Patient now lives in [place]. Aware injection due after 01-20-23. Patient states she will need to return call to office once she can figure out her travel plans back to this area. Advised could return call to prolia direct line at [phone] and leave voicemail. RN will return call for scheduling. Patient agreeable.

## 2022-12-21 NOTE — Telephone Encounter (Incomplete)
Deductible:  No   OOP MAX: No   Annual exam: 07-20-22 TW   Calcium:       9.8     Date: 01-29-22  Upcoming dental procedures: {YES/NO:21197}  Hx of Kidney Disease: {YES/NO:21197}  Last Bone Density Scan: 07-20-22  Is Prior Authorization needed: yes, on file through 03-22-23. Auth #: [id number]. Spoke with Helmut Muster at [place], will fax a copy of authorization through 03-22-23 to our office. Fax number provided. Call reference #: [id number].  Pt estimated Cost: $40

## 2023-01-03 DIAGNOSIS — M1712 Unilateral primary osteoarthritis, left knee: Secondary | ICD-10-CM | POA: Diagnosis not present

## 2023-01-03 DIAGNOSIS — M25562 Pain in left knee: Secondary | ICD-10-CM | POA: Diagnosis not present

## 2023-01-05 DIAGNOSIS — Z1212 Encounter for screening for malignant neoplasm of rectum: Secondary | ICD-10-CM | POA: Diagnosis not present

## 2023-01-05 DIAGNOSIS — M810 Age-related osteoporosis without current pathological fracture: Secondary | ICD-10-CM | POA: Diagnosis not present

## 2023-01-05 DIAGNOSIS — F988 Other specified behavioral and emotional disorders with onset usually occurring in childhood and adolescence: Secondary | ICD-10-CM | POA: Diagnosis not present

## 2023-01-05 DIAGNOSIS — R202 Paresthesia of skin: Secondary | ICD-10-CM | POA: Diagnosis not present

## 2023-01-05 DIAGNOSIS — E559 Vitamin D deficiency, unspecified: Secondary | ICD-10-CM | POA: Diagnosis not present

## 2023-01-05 DIAGNOSIS — Z1231 Encounter for screening mammogram for malignant neoplasm of breast: Secondary | ICD-10-CM | POA: Diagnosis not present

## 2023-01-05 DIAGNOSIS — Z008 Encounter for other general examination: Secondary | ICD-10-CM | POA: Diagnosis not present

## 2023-01-05 DIAGNOSIS — E785 Hyperlipidemia, unspecified: Secondary | ICD-10-CM | POA: Diagnosis not present

## 2023-01-05 DIAGNOSIS — Z1211 Encounter for screening for malignant neoplasm of colon: Secondary | ICD-10-CM | POA: Diagnosis not present

## 2023-01-06 DIAGNOSIS — F4321 Adjustment disorder with depressed mood: Secondary | ICD-10-CM | POA: Diagnosis not present

## 2023-01-15 DIAGNOSIS — E785 Hyperlipidemia, unspecified: Secondary | ICD-10-CM | POA: Diagnosis not present

## 2023-01-20 DIAGNOSIS — E785 Hyperlipidemia, unspecified: Secondary | ICD-10-CM | POA: Diagnosis not present

## 2023-01-27 DIAGNOSIS — Z79899 Other long term (current) drug therapy: Secondary | ICD-10-CM | POA: Diagnosis not present

## 2023-01-27 DIAGNOSIS — Z6835 Body mass index (BMI) 35.0-35.9, adult: Secondary | ICD-10-CM | POA: Diagnosis not present

## 2023-01-31 DIAGNOSIS — M47816 Spondylosis without myelopathy or radiculopathy, lumbar region: Secondary | ICD-10-CM | POA: Diagnosis not present

## 2023-02-03 DIAGNOSIS — F4321 Adjustment disorder with depressed mood: Secondary | ICD-10-CM | POA: Diagnosis not present

## 2023-02-14 DIAGNOSIS — E785 Hyperlipidemia, unspecified: Secondary | ICD-10-CM | POA: Diagnosis not present

## 2023-02-19 DIAGNOSIS — E785 Hyperlipidemia, unspecified: Secondary | ICD-10-CM | POA: Diagnosis not present

## 2023-04-26 ENCOUNTER — Encounter (HOSPITAL_BASED_OUTPATIENT_CLINIC_OR_DEPARTMENT_OTHER): Payer: Self-pay
